# Patient Record
Sex: Female | Born: 1940 | Race: White | Hispanic: No | Marital: Married | Smoking: Never smoker
Health system: Southern US, Community
[De-identification: ages and names within clinical notes are randomized; demographics above are authoritative.]

## PROBLEM LIST (undated history)

## (undated) DIAGNOSIS — M1990 Unspecified osteoarthritis, unspecified site: Secondary | ICD-10-CM

## (undated) DIAGNOSIS — Z8739 Personal history of other diseases of the musculoskeletal system and connective tissue: Secondary | ICD-10-CM

## (undated) DIAGNOSIS — M549 Dorsalgia, unspecified: Secondary | ICD-10-CM

---

## 1997-12-12 ENCOUNTER — Emergency Department (HOSPITAL_COMMUNITY): Admission: EM | Admit: 1997-12-12 | Discharge: 1997-12-12 | Payer: Self-pay | Admitting: Internal Medicine

## 2003-03-03 ENCOUNTER — Encounter
Admission: RE | Admit: 2003-03-03 | Discharge: 2003-03-03 | Payer: Self-pay | Admitting: Physical Medicine and Rehabilitation

## 2006-03-11 ENCOUNTER — Encounter: Admission: RE | Admit: 2006-03-11 | Discharge: 2006-03-11 | Payer: Self-pay | Admitting: Obstetrics and Gynecology

## 2006-04-01 ENCOUNTER — Encounter: Admission: RE | Admit: 2006-04-01 | Discharge: 2006-04-01 | Payer: Self-pay | Admitting: Obstetrics and Gynecology

## 2019-10-25 ENCOUNTER — Encounter (HOSPITAL_COMMUNITY): Payer: Self-pay | Admitting: Emergency Medicine

## 2019-10-25 ENCOUNTER — Other Ambulatory Visit: Payer: Self-pay

## 2019-10-25 ENCOUNTER — Emergency Department (HOSPITAL_COMMUNITY): Payer: Medicare Other

## 2019-10-25 ENCOUNTER — Emergency Department (HOSPITAL_COMMUNITY)
Admission: EM | Admit: 2019-10-25 | Discharge: 2019-10-25 | Disposition: A | Discharge status: Home / Self Care | Payer: Medicare Other | Attending: Emergency Medicine | Admitting: Emergency Medicine

## 2019-10-25 DIAGNOSIS — R42 Dizziness and giddiness: Secondary | ICD-10-CM | POA: Diagnosis not present

## 2019-10-25 DIAGNOSIS — R509 Fever, unspecified: Secondary | ICD-10-CM | POA: Diagnosis present

## 2019-10-25 DIAGNOSIS — Z79899 Other long term (current) drug therapy: Secondary | ICD-10-CM | POA: Insufficient documentation

## 2019-10-25 DIAGNOSIS — R4182 Altered mental status, unspecified: Secondary | ICD-10-CM | POA: Diagnosis not present

## 2019-10-25 DIAGNOSIS — U071 COVID-19: Secondary | ICD-10-CM | POA: Insufficient documentation

## 2019-10-25 HISTORY — DX: Unspecified osteoarthritis, unspecified site: M19.90

## 2019-10-25 HISTORY — DX: Dorsalgia, unspecified: M54.9

## 2019-10-25 HISTORY — DX: Personal history of other diseases of the musculoskeletal system and connective tissue: Z87.39

## 2019-10-25 LAB — URINALYSIS, ROUTINE W REFLEX MICROSCOPIC
Bilirubin Urine: NEGATIVE
Glucose, UA: NEGATIVE mg/dL
Hgb urine dipstick: NEGATIVE
Ketones, ur: NEGATIVE mg/dL
Leukocytes,Ua: NEGATIVE
Nitrite: NEGATIVE
Protein, ur: NEGATIVE mg/dL
Specific Gravity, Urine: 1.021 (ref 1.005–1.030)
pH: 6 (ref 5.0–8.0)

## 2019-10-25 LAB — COMPREHENSIVE METABOLIC PANEL
ALT: 42 U/L (ref 0–44)
AST: 59 U/L — ABNORMAL HIGH (ref 15–41)
Albumin: 3.5 g/dL (ref 3.5–5.0)
Alkaline Phosphatase: 60 U/L (ref 38–126)
Anion gap: 12 (ref 5–15)
BUN: 29 mg/dL — ABNORMAL HIGH (ref 8–23)
CO2: 28 mmol/L (ref 22–32)
Calcium: 8.7 mg/dL — ABNORMAL LOW (ref 8.9–10.3)
Chloride: 94 mmol/L — ABNORMAL LOW (ref 98–111)
Creatinine, Ser: 1.04 mg/dL — ABNORMAL HIGH (ref 0.44–1.00)
GFR calc Af Amer: >60 mL/min (ref >60)
GFR calc non Af Amer: 52 mL/min — ABNORMAL LOW (ref >60)
Glucose, Bld: 91 mg/dL (ref 70–99)
Potassium: 4.3 mmol/L (ref 3.5–5.1)
Sodium: 134 mmol/L — ABNORMAL LOW (ref 135–145)
Total Bilirubin: 0.4 mg/dL (ref 0.3–1.2)
Total Protein: 7.6 g/dL (ref 6.5–8.1)

## 2019-10-25 LAB — CBC WITH DIFFERENTIAL/PLATELET
Abs Immature Granulocytes: 0.05 10*3/uL (ref 0.00–0.07)
Basophils Absolute: 0 10*3/uL (ref 0.0–0.1)
Basophils Relative: 1 %
Eosinophils Absolute: 0.1 10*3/uL (ref 0.0–0.5)
Eosinophils Relative: 1 %
HCT: 37.1 % (ref 36.0–46.0)
Hemoglobin: 12 g/dL (ref 12.0–15.0)
Immature Granulocytes: 1 %
Lymphocytes Relative: 28 %
Lymphs Abs: 1.7 10*3/uL (ref 0.7–4.0)
MCH: 30.8 pg (ref 26.0–34.0)
MCHC: 32.3 g/dL (ref 30.0–36.0)
MCV: 95.4 fL (ref 80.0–100.0)
Monocytes Absolute: 1 10*3/uL (ref 0.1–1.0)
Monocytes Relative: 17 %
Neutro Abs: 3.3 10*3/uL (ref 1.7–7.7)
Neutrophils Relative %: 52 %
Platelets: 218 10*3/uL (ref 150–400)
RBC: 3.89 MIL/uL (ref 3.87–5.11)
RDW: 13.4 % (ref 11.5–15.5)
WBC: 6.3 10*3/uL (ref 4.0–10.5)
nRBC: 0 % (ref 0.0–0.2)

## 2019-10-25 LAB — LACTIC ACID, PLASMA: Lactic Acid, Venous: 1.6 mmol/L (ref 0.5–1.9)

## 2019-10-25 LAB — SARS CORONAVIRUS 2 BY RT PCR (HOSPITAL ORDER, PERFORMED IN ~~LOC~~ HOSPITAL LAB): SARS Coronavirus 2: POSITIVE — AB

## 2019-10-25 LAB — CBG MONITORING, ED: Glucose-Capillary: 98 mg/dL (ref 70–99)

## 2019-10-25 LAB — AMMONIA: Ammonia: 30 umol/L (ref 9–35)

## 2019-10-25 MED ORDER — ACETAMINOPHEN 500 MG PO TABS
1000.0000 mg | ORAL_TABLET | Freq: Once | ORAL | Status: AC
  Administered 2019-10-25: 1000 mg via ORAL
  Filled 2019-10-25: qty 2

## 2019-10-25 NOTE — ED Triage Notes (Signed)
Patient presents after a visit to urgent care. While at Urgent care she was examined for a possible stroke, but that was ruled out. However, because today she has had a fever, confusion, dizziness, trouble concentrating and completing simple tasks, she was told to come here. The son reports her pupils have been non-reactive and he saw her staggering across the room earlier. She normally receives care for back pain which causes leg weakness and trouble standing. She takes opoid pain medication.

## 2019-10-25 NOTE — ED Provider Notes (Signed)
[facility]-EMERGENCY DEPT Provider Note   CSN: [id number] Arrival date & time: 10/25/19  1715     History Chief Complaint  Patient presents with  . Dizziness  . Altered Mental Status  . Fever    Nina White is a 79 y.o. female presenting for evaluation of confusion, difficulty walking, and dizziness.  , 5 caveat due to confusion.  History provided mostly by patient son, Thayer Ohm.  He states patient has not been acting herself today.  When she was walking, as if she was "drunk."  They thought this was due to chronic back pain, and saw the Ortho urgent care PA.  Patient then reported that she was having dizziness.  She did receive her third Covid vaccine today, but due to her symptoms ended up coming to the ER.  Son reports a mild cough that was noted today.  Patient denies headache, chest pain, shortness of breath, nausea, vomiting abdominal pain, urinary symptoms, abnormal bowel movements.  Patient and son were unaware that she had a fever.  Additional history obtained from chart review.  Patient with a history of RA, is on immunosuppression for this.  Has a history of chronic back pain, is on fentanyl patch and hydrocodone.  HPI     Past Medical History:  Diagnosis Date  . Arthritis   . Back pain   . H/O degenerative disc disease     There are no problems to display for this patient.   History reviewed. No pertinent surgical history.   OB History   No obstetric history on file.     History reviewed. No pertinent family history.  Social History   Tobacco Use  . Smoking status: Never Smoker  . Smokeless tobacco: Never Used  Substance Use Topics  . Alcohol use: Not on file  . Drug use: Not on file    Home Medications Prior to Admission medications   Medication Sig Start Date End Date Taking? Authorizing Provider  acyclovir (ZOVIRAX) 200 MG capsule Take 200 mg by mouth daily.  10/22/19  Yes [provider]  alendronate (FOSAMAX)  70 MG tablet Take 70 mg by mouth once a week. 08/01/19  Yes [provider]  amitriptyline (ELAVIL) 25 MG tablet Take 25 mg by mouth at bedtime.  10/22/19  Yes [provider]  baclofen (LIORESAL) 10 MG tablet Take 10 mg by mouth 2 (two) times daily.  10/21/19  Yes [provider]  brimonidine (ALPHAGAN) 0.2 % ophthalmic solution Place 1 drop into both eyes in the morning and at bedtime.  10/21/19  Yes [provider]  fentaNYL (DURAGESIC) 25 MCG/HR Place 1 patch onto the skin every 3 (three) days.  10/13/19  Yes [provider]  gabapentin (NEURONTIN) 400 MG capsule Take 400-800 mg by mouth 2 (two) times daily. Take 2 capsules (800 mg) in the morning and Take 1 capsule (400 mg) at bedtime 09/20/19  Yes [provider]  HYDROcodone-acetaminophen (NORCO) 10-325 MG tablet Take 1 tablet by mouth 3 (three) times daily as needed for moderate pain or severe pain.  10/13/19  Yes [provider]  levothyroxine (SYNTHROID) 88 MCG tablet Take 88 mcg by mouth daily. 08/23/19  Yes [provider]  lovastatin (MEVACOR) 10 MG tablet Take 10 mg by mouth daily. 10/21/19  Yes [provider]    Allergies    Erythromycin and Tetracyclines & related  Review of Systems   Review of Systems  Neurological: Positive for dizziness.  Gait abnormality  Psychiatric/Behavioral: Positive for confusion.  All other systems reviewed and are negative.   Physical Exam Updated Vital Signs BP (!) 100/56   Pulse 62   Temp (!) 102.7 F (39.3 C) (Oral)   Resp (!) 24   Ht 5' (1.524 m)   Wt 59 kg   SpO2 99%   BMI 25.39 kg/m   Physical Exam Vitals and nursing note reviewed.  Constitutional:      General: She is not in acute distress.    Appearance: She is well-developed.     Comments: Appears nontoxic  HENT:     Head: Normocephalic and atraumatic.  Eyes:     Conjunctiva/sclera: Conjunctivae normal.     Pupils: Pupils are equal, round, and  reactive to light.  Cardiovascular:     Rate and Rhythm: Normal rate and regular rhythm.     Pulses: Normal pulses.  Pulmonary:     Effort: Pulmonary effort is normal. No respiratory distress.     Breath sounds: Normal breath sounds. No wheezing.     Comments: Speaking in full sentences.  Clear lung sounds in all fields. Abdominal:     General: There is no distension.     Palpations: Abdomen is soft. There is no mass.     Tenderness: There is no abdominal tenderness. There is no guarding or rebound.  Musculoskeletal:        General: Normal range of motion.     Cervical back: Normal range of motion and neck supple.  Skin:    General: Skin is warm and dry.     Capillary Refill: Capillary refill takes less than 2 seconds.  Neurological:     General: No focal deficit present.     Mental Status: She is alert and oriented to person, place, and time.     GCS: GCS eye subscore is 4. GCS verbal subscore is 5. GCS motor subscore is 6.     Cranial Nerves: Cranial nerves are intact.     Sensory: Sensation is intact.     Motor: Motor function is intact.     Coordination: Coordination is intact.     Comments: CN intact.  Nose to finger intact.  Fine movement and coordination intact.  Strength and sensation intact x4.  After antipyretics, patient ambulated without ataxia.     ED Results / Procedures / Treatments   Labs (all labs ordered are listed, but only abnormal results are displayed) Labs Reviewed  SARS CORONAVIRUS 2 BY RT PCR (HOSPITAL ORDER, PERFORMED IN [facility]) - Abnormal; Notable for the following components:      Result Value   SARS Coronavirus 2 POSITIVE (*)    All other components within normal limits  COMPREHENSIVE METABOLIC PANEL - Abnormal; Notable for the following components:   Sodium 134 (*)    Chloride 94 (*)    BUN 29 (*)    Creatinine, Ser 1.04 (*)    Calcium 8.7 (*)    AST 59 (*)    GFR calc non Af Amer 52 (*)    All other components within  normal limits  URINE CULTURE  CULTURE, BLOOD (ROUTINE X 2)  CULTURE, BLOOD (ROUTINE X 2)  CBC WITH DIFFERENTIAL/PLATELET  AMMONIA  URINALYSIS, ROUTINE W REFLEX MICROSCOPIC  LACTIC ACID, PLASMA  LACTIC ACID, PLASMA  CBG MONITORING, ED    EKG EKG Interpretation  Date/Time:  Monday October 25 2019 18:02:05 EDT Ventricular Rate:  72 PR Interval:    QRS Duration:  81 QT Interval:  368 QTC Calculation: 403 R Axis:   10 Text Interpretation: Sinus rhythm Atrial premature complex Low voltage, precordial leads No old tracing to compare Confirmed by Meridee Score [phone]) on 10/25/2019 6:56:53 PM   Radiology DG Chest Portable 1 View  Result Date: 10/25/2019 CLINICAL DATA:  Fever and dizziness. EXAM: PORTABLE CHEST 1 VIEW COMPARISON:  August 05, 2017 FINDINGS: Mild diffuse chronic appearing increased lung markings are seen with a trace amount of bibasilar atelectasis. There is no evidence of acute infiltrate, pleural effusion or pneumothorax. The heart size and mediastinal contours are within normal limits. The visualized skeletal structures are unremarkable. IMPRESSION: No active disease. Electronically Signed   By: Aram Candela M.D.   On: 10/25/2019 19:49    Procedures Procedures (including critical care time)  Medications Ordered in ED Medications  acetaminophen (TYLENOL) tablet 1,000 mg (1,000 mg Oral Given 10/25/19 1855)    ED Course  I have reviewed the triage vital signs and the nursing notes.  Pertinent labs & imaging results that were available during my care of the patient were reviewed by me and considered in my medical decision making (see chart for details).  Clinical Course as of Oct 24 2105  Mon Oct 25, 2019  5352 79 year old female history of rheumatoid arthritis chronic narcotics for pain here with gait imbalance confusion starting sometime today likely preceding her Covid booster shot.  She does appear with a fever 103.  Denies any infectious complaints.  Labs  chest x-ray urinalysis Covid testing.  Disposition per results of work-up.   [MB]    Clinical Course User Index [MB] Terrilee Files, MD   MDM Rules/Calculators/A&P                          Patient presenting for evaluation of confusion and gait difficulty.  On exam, patient appears nontoxic.  Patient is febrile up to 103, this is likely the cause for her symptoms.  Less likely stroke without focal neurologic deficits.  Will obtain chest x-ray and urine to look for infection.  Will obtain labs to ensure no systemic response, but vital signs overall are reassuring. Case discussed with attending, Dr. Charm Barges evaluated the pt.   Labs interpreted by me, overall reassuring.  No acidosis.  Kidney and liver function normal.  Electrolytes stable.  EKG without ischemia.  Chest x-ray read interpreted by me, no pneumonia, pnx, effusion, cardiomegaly.  Urine without infection.  Blood cultures sent and pending.  Covid test positive.  This is likely the cause for fever.  On reassessment after antipyretics, patient is symptom-free.  She is ambulatory without ataxia.  I do not believe she needs MRI or imaging of the head at this time.  After ambulation, patient sats remained above 95%.  She does not have any signs of respiratory distress.  I do not believe she needs to be admitted for Covid.  I discussed findings with patient and son.  Discussed importance of isolation, as well as quarantine of anyone who has been around her who is not vaccinated.  Discussed monitoring for respiratory status, and return with any worsening.  At this time, patient appears safe for discharge.  Return precautions given.  Patient and son state they understand and agree to plan.  Final Clinical Impression(s) / ED Diagnoses Final diagnoses:  COVID-19    Rx / DC Orders ED Discharge Orders    None       Mykenna Viele, PA-C  10/25/19 2115    Terrilee Files, MD 10/26/19 1047

## 2019-10-25 NOTE — ED Notes (Signed)
Patient's son says the patient had the 3rd Moderna shot today.

## 2019-10-25 NOTE — Discharge Instructions (Signed)
Quarantine Quarantine if you have been in close contact (within 6 feet of someone for a cumulative total of 15 minutes or more over a 24-hour period) with someone who has COVID-19, unless you have been fully vaccinated. People who are fully vaccinated do NOT need to quarantine after contact with someone who had COVID-19 unless they have symptoms. However, fully vaccinated people should get tested 3-5 days after their exposure, even if they don't have symptoms and wear a mask indoors in public for 14 days following exposure or until their test result is negative.  What to do Stay home for 14 days after your last contact with a person who has COVID-19. Watch for fever (100.4?F), cough, shortness of breath, or other symptoms of COVID-19. If possible, stay away from people you live with, especially people who are at higher risk for getting very sick from COVID-19. After quarantine Watch for symptoms until 14 days after exposure. If you have symptoms, immediately self-isolate and contact your local public health authority or healthcare provider. You may be able to shorten your quarantine Your local public health authorities make the final decisions about how long quarantine should last, based on local conditions and needs. Follow the recommendations of your local public health department if you need to quarantine. Options they will consider include stopping quarantine  After day 10 without testing After day 7 after receiving a negative test result (test must occur on day 5 or later)  Isolation Isolation is used to separate people infected with COVID-19 from those who are not infected.  People who are in isolation should stay home until it's safe for them to be around others. At home, anyone sick or infected should separate from others, stay in a specific "sick room" or area, and use a separate bathroom (if available).  What to do Monitor your symptoms. If you have an emergency warning sign  (including trouble breathing), seek emergency medical care immediately. Stay in a separate room from other household members, if possible. Use a separate bathroom, if possible. Avoid contact with other members of the household and pets. Don't share personal household items, like cups, towels, and utensils. Wear a mask when around other people if able. Learn more about what to do if you are sick and how to notify your contacts.  For Anyone Who Has Been Around a Person with COVID-19 Anyone who has had close contact with someone with COVID-19 should stay home for 14 days after their last exposure to that person. However, anyone who has had close contact with someone with COVID-19 and who meets the following criteria does NOT need to stay home.  Someone who has been fully vaccinated and shows no symptoms of COVID-19. However, fully vaccinated people should get tested 3-5 days after their exposure, even they don't have symptoms and wear a mask indoors in public for 14 days following exposure or until their test result is negative. Or  Someone who has COVID-19 illness within the previous 3 months and Has recovered and Remains without COVID-19 symptoms (for example, cough, shortness of breath) I think or know I had COVID-19, and I had symptoms You can be around others after:  10 days since symptoms first appeared and 24 hours with no fever without the use of fever-reducing medications and Other symptoms of COVID-19 are improving* *Loss of taste and smell may persist for weeks or months after recovery and need not delay the end of isolation   I tested positive for COVID-19 but had no  symptoms If you continue to have no symptoms, you can be with others after 10 days have passed since you had a positive viral test for COVID-19.  If you develop symptoms after testing positive, follow the guidance above for "I think or know I had COVID-19, and I had symptoms."  I was severely ill with COVID-19 or  have a weakened immune system (immunocompromised) caused by a health condition or medication. People who are severely ill with COVID-19 might need to stay home longer than 10 days and up to 20 days after symptoms first appeared. People with weakened immune systems may require testing to determine when they can be around others. Talk to your healthcare provider for more information. Your healthcare provider will let you know if you can resume being around other people based on the results of your testing.  People who are immunocompromised should be counseled about the potential for reduced immune responses to COVID-19 vaccines and the need to continue to follow?current prevention measures?(including wearing a mask, staying 6 feet apart from others?they don't live with, and avoiding crowds and poorly ventilated indoor spaces) to protect themselves against COVID-19 until advised otherwise by their healthcare provider. Close contacts of immunocompromised people should also be encouraged to be vaccinated against COVID-19 to help protect these people.

## 2019-10-26 ENCOUNTER — Telehealth: Payer: Self-pay | Admitting: Oncology

## 2019-10-26 ENCOUNTER — Telehealth (HOSPITAL_COMMUNITY): Payer: Self-pay | Admitting: Nurse Practitioner

## 2019-10-26 DIAGNOSIS — U071 COVID-19: Secondary | ICD-10-CM

## 2019-10-26 LAB — URINE CULTURE

## 2019-10-26 NOTE — Telephone Encounter (Signed)
Called to Discuss with patient about Covid symptoms and the use of regeneron, a monoclonal antibody infusion for those with mild to moderate Covid symptoms and at a high risk of hospitalization.     Pt is qualified for this infusion at the [facility] due to co-morbid conditions and/or a member of an at-risk group.     Unable to reach pt. Left message to return call  Jehan Ranganathan, DNP, AGNP-C [phone] (Infusion Center Hotline)  

## 2019-10-27 ENCOUNTER — Other Ambulatory Visit: Payer: Self-pay

## 2019-10-27 ENCOUNTER — Emergency Department (HOSPITAL_BASED_OUTPATIENT_CLINIC_OR_DEPARTMENT_OTHER): Payer: Medicare (Managed Care)

## 2019-10-27 ENCOUNTER — Inpatient Hospital Stay (HOSPITAL_BASED_OUTPATIENT_CLINIC_OR_DEPARTMENT_OTHER)
Admission: EM | Admit: 2019-10-27 | Discharge: 2019-10-30 | DRG: 177 | Disposition: A | Discharge status: Home / Self Care | Payer: Medicare (Managed Care) | Attending: Student | Admitting: Student

## 2019-10-27 ENCOUNTER — Encounter (HOSPITAL_BASED_OUTPATIENT_CLINIC_OR_DEPARTMENT_OTHER): Payer: Self-pay

## 2019-10-27 DIAGNOSIS — G894 Chronic pain syndrome: Secondary | ICD-10-CM | POA: Diagnosis not present

## 2019-10-27 DIAGNOSIS — R627 Adult failure to thrive: Secondary | ICD-10-CM | POA: Diagnosis present

## 2019-10-27 DIAGNOSIS — E860 Dehydration: Secondary | ICD-10-CM | POA: Diagnosis present

## 2019-10-27 DIAGNOSIS — G9341 Metabolic encephalopathy: Secondary | ICD-10-CM | POA: Diagnosis present

## 2019-10-27 DIAGNOSIS — E876 Hypokalemia: Secondary | ICD-10-CM | POA: Diagnosis present

## 2019-10-27 DIAGNOSIS — R030 Elevated blood-pressure reading, without diagnosis of hypertension: Secondary | ICD-10-CM | POA: Diagnosis not present

## 2019-10-27 DIAGNOSIS — T50B95A Adverse effect of other viral vaccines, initial encounter: Secondary | ICD-10-CM | POA: Diagnosis present

## 2019-10-27 DIAGNOSIS — E785 Hyperlipidemia, unspecified: Secondary | ICD-10-CM | POA: Diagnosis present

## 2019-10-27 DIAGNOSIS — R509 Fever, unspecified: Secondary | ICD-10-CM | POA: Diagnosis present

## 2019-10-27 DIAGNOSIS — U071 COVID-19: Principal | ICD-10-CM

## 2019-10-27 DIAGNOSIS — M069 Rheumatoid arthritis, unspecified: Secondary | ICD-10-CM | POA: Diagnosis present

## 2019-10-27 DIAGNOSIS — Z79891 Long term (current) use of opiate analgesic: Secondary | ICD-10-CM | POA: Diagnosis not present

## 2019-10-27 DIAGNOSIS — Z9189 Other specified personal risk factors, not elsewhere classified: Secondary | ICD-10-CM | POA: Diagnosis not present

## 2019-10-27 DIAGNOSIS — G8929 Other chronic pain: Secondary | ICD-10-CM | POA: Diagnosis present

## 2019-10-27 DIAGNOSIS — R112 Nausea with vomiting, unspecified: Secondary | ICD-10-CM

## 2019-10-27 DIAGNOSIS — Z881 Allergy status to other antibiotic agents status: Secondary | ICD-10-CM | POA: Diagnosis not present

## 2019-10-27 DIAGNOSIS — I10 Essential (primary) hypertension: Secondary | ICD-10-CM | POA: Diagnosis present

## 2019-10-27 DIAGNOSIS — M549 Dorsalgia, unspecified: Secondary | ICD-10-CM | POA: Diagnosis present

## 2019-10-27 DIAGNOSIS — Z6823 Body mass index (BMI) 23.0-23.9, adult: Secondary | ICD-10-CM | POA: Diagnosis not present

## 2019-10-27 DIAGNOSIS — Z7983 Long term (current) use of bisphosphonates: Secondary | ICD-10-CM

## 2019-10-27 DIAGNOSIS — Z79899 Other long term (current) drug therapy: Secondary | ICD-10-CM | POA: Diagnosis not present

## 2019-10-27 DIAGNOSIS — M1990 Unspecified osteoarthritis, unspecified site: Secondary | ICD-10-CM | POA: Diagnosis present

## 2019-10-27 DIAGNOSIS — Z79890 Hormone replacement therapy (postmenopausal): Secondary | ICD-10-CM | POA: Diagnosis not present

## 2019-10-27 DIAGNOSIS — R590 Localized enlarged lymph nodes: Secondary | ICD-10-CM | POA: Diagnosis present

## 2019-10-27 DIAGNOSIS — E039 Hypothyroidism, unspecified: Secondary | ICD-10-CM | POA: Diagnosis present

## 2019-10-27 DIAGNOSIS — R0600 Dyspnea, unspecified: Secondary | ICD-10-CM

## 2019-10-27 DIAGNOSIS — E871 Hypo-osmolality and hyponatremia: Secondary | ICD-10-CM | POA: Diagnosis present

## 2019-10-27 DIAGNOSIS — Z23 Encounter for immunization: Secondary | ICD-10-CM | POA: Diagnosis not present

## 2019-10-27 LAB — CBC WITH DIFFERENTIAL/PLATELET
Abs Immature Granulocytes: 0.01 10*3/uL (ref 0.00–0.07)
Basophils Absolute: 0 10*3/uL (ref 0.0–0.1)
Basophils Relative: 0 %
Eosinophils Absolute: 0 10*3/uL (ref 0.0–0.5)
Eosinophils Relative: 1 %
HCT: 39.1 % (ref 36.0–46.0)
Hemoglobin: 13.2 g/dL (ref 12.0–15.0)
Immature Granulocytes: 0 %
Lymphocytes Relative: 24 %
Lymphs Abs: 1 10*3/uL (ref 0.7–4.0)
MCH: 31 pg (ref 26.0–34.0)
MCHC: 33.8 g/dL (ref 30.0–36.0)
MCV: 91.8 fL (ref 80.0–100.0)
Monocytes Absolute: 0.5 10*3/uL (ref 0.1–1.0)
Monocytes Relative: 13 %
Neutro Abs: 2.7 10*3/uL (ref 1.7–7.7)
Neutrophils Relative %: 62 %
Platelets: 179 10*3/uL (ref 150–400)
RBC: 4.26 MIL/uL (ref 3.87–5.11)
RDW: 12.8 % (ref 11.5–15.5)
WBC: 4.3 10*3/uL (ref 4.0–10.5)
nRBC: 0 % (ref 0.0–0.2)

## 2019-10-27 LAB — BASIC METABOLIC PANEL
Anion gap: 10 (ref 5–15)
Anion gap: 13 (ref 5–15)
BUN: 13 mg/dL (ref 8–23)
BUN: 9 mg/dL (ref 8–23)
CO2: 23 mmol/L (ref 22–32)
CO2: 21 mmol/L — ABNORMAL LOW (ref 22–32)
Calcium: 8.6 mg/dL — ABNORMAL LOW (ref 8.9–10.3)
Calcium: 8.5 mg/dL — ABNORMAL LOW (ref 8.9–10.3)
Chloride: 90 mmol/L — ABNORMAL LOW (ref 98–111)
Chloride: 94 mmol/L — ABNORMAL LOW (ref 98–111)
Creatinine, Ser: 0.94 mg/dL (ref 0.44–1.00)
Creatinine, Ser: 0.74 mg/dL (ref 0.44–1.00)
GFR calc Af Amer: >60 mL/min (ref >60)
GFR calc Af Amer: >60 mL/min (ref >60)
GFR calc non Af Amer: 59 mL/min — ABNORMAL LOW (ref >60)
GFR calc non Af Amer: >60 mL/min (ref >60)
Glucose, Bld: 119 mg/dL — ABNORMAL HIGH (ref 70–99)
Glucose, Bld: 120 mg/dL — ABNORMAL HIGH (ref 70–99)
Potassium: 4.1 mmol/L (ref 3.5–5.1)
Potassium: 4.2 mmol/L (ref 3.5–5.1)
Sodium: 123 mmol/L — ABNORMAL LOW (ref 135–145)
Sodium: 128 mmol/L — ABNORMAL LOW (ref 135–145)

## 2019-10-27 LAB — TRIGLYCERIDES: Triglycerides: 81 mg/dL (ref <150)

## 2019-10-27 LAB — C-REACTIVE PROTEIN: CRP: 1.8 mg/dL — ABNORMAL HIGH (ref <1.0)

## 2019-10-27 LAB — URINALYSIS, ROUTINE W REFLEX MICROSCOPIC
Bilirubin Urine: NEGATIVE
Glucose, UA: NEGATIVE mg/dL
Hgb urine dipstick: NEGATIVE
Ketones, ur: NEGATIVE mg/dL
Leukocytes,Ua: NEGATIVE
Nitrite: NEGATIVE
Protein, ur: NEGATIVE mg/dL
Specific Gravity, Urine: 1.01 (ref 1.005–1.030)
pH: 8 (ref 5.0–8.0)

## 2019-10-27 LAB — HEPATIC FUNCTION PANEL
ALT: 35 U/L (ref 0–44)
AST: 53 U/L — ABNORMAL HIGH (ref 15–41)
Albumin: 3.4 g/dL — ABNORMAL LOW (ref 3.5–5.0)
Alkaline Phosphatase: 58 U/L (ref 38–126)
Bilirubin, Direct: 0.2 mg/dL (ref 0.0–0.2)
Indirect Bilirubin: 0.4 mg/dL (ref 0.3–0.9)
Total Bilirubin: 0.6 mg/dL (ref 0.3–1.2)
Total Protein: 7.6 g/dL (ref 6.5–8.1)

## 2019-10-27 LAB — LIPASE, BLOOD: Lipase: 46 U/L (ref 11–51)

## 2019-10-27 LAB — D-DIMER, QUANTITATIVE: D-Dimer, Quant: 0.69 ug/mL-FEU — ABNORMAL HIGH (ref 0.00–0.50)

## 2019-10-27 LAB — FERRITIN: Ferritin: 73 ng/mL (ref 11–307)

## 2019-10-27 LAB — SODIUM, URINE, RANDOM: Sodium, Ur: 60 mmol/L

## 2019-10-27 LAB — PROCALCITONIN: Procalcitonin: 0.12 ng/mL

## 2019-10-27 LAB — FIBRINOGEN: Fibrinogen: 439 mg/dL (ref 210–475)

## 2019-10-27 LAB — LACTATE DEHYDROGENASE: LDH: 225 U/L — ABNORMAL HIGH (ref 98–192)

## 2019-10-27 MED ORDER — ENOXAPARIN SODIUM 40 MG/0.4ML ~~LOC~~ SOLN
40.0000 mg | SUBCUTANEOUS | Status: DC
  Administered 2019-10-27 – 2019-10-29 (×3): 40 mg via SUBCUTANEOUS
  Filled 2019-10-27 (×3): qty 0.4

## 2019-10-27 MED ORDER — SODIUM CHLORIDE 0.9 % IV SOLN
1200.0000 mg | Freq: Once | INTRAVENOUS | Status: AC
  Administered 2019-10-27: 1200 mg via INTRAVENOUS
  Filled 2019-10-27: qty 10

## 2019-10-27 MED ORDER — ACETAMINOPHEN 650 MG RE SUPP: 650.0000 mg | Freq: Four times a day (QID) | RECTAL | Status: DC | PRN

## 2019-10-27 MED ORDER — SODIUM CHLORIDE 0.9 % IV SOLN: INTRAVENOUS | Status: DC

## 2019-10-27 MED ORDER — SODIUM CHLORIDE 0.9 % IV SOLN: INTRAVENOUS | Status: DC | PRN

## 2019-10-27 MED ORDER — ONDANSETRON HCL 4 MG/2ML IJ SOLN
4.0000 mg | Freq: Once | INTRAMUSCULAR | Status: AC
  Administered 2019-10-27: 4 mg via INTRAVENOUS
  Filled 2019-10-27: qty 2

## 2019-10-27 MED ORDER — DIPHENHYDRAMINE HCL 50 MG/ML IJ SOLN: 50.0000 mg | Freq: Once | INTRAMUSCULAR | Status: DC | PRN

## 2019-10-27 MED ORDER — EPINEPHRINE 0.3 MG/0.3ML IJ SOAJ
0.3000 mg | Freq: Once | INTRAMUSCULAR | Status: DC | PRN
  Filled 2019-10-27: qty 0.6

## 2019-10-27 MED ORDER — SODIUM CHLORIDE 0.9 % IV BOLUS
1000.0000 mL | Freq: Once | INTRAVENOUS | Status: AC
  Administered 2019-10-27: 1000 mL via INTRAVENOUS

## 2019-10-27 MED ORDER — LIP MEDEX EX OINT: TOPICAL_OINTMENT | CUTANEOUS | Status: AC | PRN

## 2019-10-27 MED ORDER — LIP MEDEX EX OINT
TOPICAL_OINTMENT | CUTANEOUS | Status: AC
  Administered 2019-10-27: 1
  Filled 2019-10-27: qty 7

## 2019-10-27 MED ORDER — FAMOTIDINE IN NACL 20-0.9 MG/50ML-% IV SOLN: 20.0000 mg | Freq: Once | INTRAVENOUS | Status: DC | PRN

## 2019-10-27 MED ORDER — ONDANSETRON HCL 4 MG/2ML IJ SOLN
4.0000 mg | Freq: Four times a day (QID) | INTRAMUSCULAR | Status: DC | PRN
  Administered 2019-10-27 – 2019-10-28 (×2): 4 mg via INTRAVENOUS
  Filled 2019-10-27 (×2): qty 2

## 2019-10-27 MED ORDER — METHYLPREDNISOLONE SODIUM SUCC 125 MG IJ SOLR: 125.0000 mg | Freq: Once | INTRAMUSCULAR | Status: DC | PRN

## 2019-10-27 MED ORDER — ACETAMINOPHEN 325 MG PO TABS
650.0000 mg | ORAL_TABLET | Freq: Once | ORAL | Status: AC
  Administered 2019-10-27: 650 mg via ORAL
  Filled 2019-10-27: qty 2

## 2019-10-27 MED ORDER — ALBUTEROL SULFATE HFA 108 (90 BASE) MCG/ACT IN AERS: 2.0000 | INHALATION_SPRAY | Freq: Once | RESPIRATORY_TRACT | Status: DC | PRN

## 2019-10-27 MED ORDER — ACETAMINOPHEN 325 MG PO TABS: 650.0000 mg | ORAL_TABLET | Freq: Four times a day (QID) | ORAL | Status: DC | PRN

## 2019-10-27 MED ORDER — HYDRALAZINE HCL 20 MG/ML IJ SOLN
5.0000 mg | INTRAMUSCULAR | Status: DC | PRN
  Administered 2019-10-27 – 2019-10-28 (×2): 5 mg via INTRAVENOUS
  Filled 2019-10-27 (×2): qty 1

## 2019-10-27 NOTE — ED Notes (Signed)
Pt pulled her monitor leads and BP cuff, got dressed , reports want leave.This Rn explained that she has a bed available and will be transferred soon. Pt appears to be confused. Family made ware and spoke to pt . Pt back in bed waiting for transport.

## 2019-10-27 NOTE — ED Triage Notes (Addendum)
Per pt and son pt with SOB, decreased po intake, fever-concerned pt may be dehydrated-son reports periods of confusion x 2 days that clear when no fever-dx with covid 2 days ago-pt to triage in w/c-NAD

## 2019-10-27 NOTE — ED Notes (Signed)
Pt moved from bed to bedside toilet unassisted

## 2019-10-27 NOTE — Plan of Care (Signed)
Plan of care discussed with Thayer Ohm and Patient.  Thayer Ohm reports having medical POA if she is unable to make decisions for herself. Copy requested.  Thayer Ohm will look for script for fentanyl patch to explain why Patient has one on abdomen.  Writer will inform On-Call of 25 mcg patch. Chronic low back and arthritis pain reported.

## 2019-10-27 NOTE — ED Notes (Signed)
Reprt called to [place], Charity fundraiser at [employer].

## 2019-10-27 NOTE — ED Provider Notes (Signed)
[facility] EMERGENCY DEPARTMENT Provider Note   CSN: [id number] Arrival date & time: 10/27/19  1234  History Chief Complaint  Patient presents with  . Shortness of Breath   Nina White is a 79 y.o. female.  79 year old female began having symptoms of dizziness and confusion Monday morning (8/30).  Son brought her to doctor's appointments and to get [place] booster.  She developed a fever of 103 and tested positive for Covid.  She has not been able to tolerate anything p.o. since Sunday night.  Attempts at feeding her have resulted in her vomiting.  She continues to have fevers and confusion that are improved with Tylenol. Son endorses mother having a yellow phlegm with cough. Blood cultures collected Monday were negative.     Past Medical History:  Diagnosis Date  . Arthritis   . Back pain   . H/O degenerative disc disease    There are no problems to display for this patient.  History reviewed. No pertinent surgical history.   OB History   No obstetric history on file.    No family history on file.  Social History   Tobacco Use  . Smoking status: Never Smoker  . Smokeless tobacco: Never Used  Substance Use Topics  . Alcohol use: Never  . Drug use: Not on file   Home Medications Prior to Admission medications   Medication Sig Start Date End Date Taking? Authorizing Provider  acyclovir (ZOVIRAX) 200 MG capsule Take 200 mg by mouth daily.  10/22/19   [provider]  alendronate (FOSAMAX) 70 MG tablet Take 70 mg by mouth once a week. 08/01/19   [provider]  amitriptyline (ELAVIL) 25 MG tablet Take 25 mg by mouth at bedtime.  10/22/19   [provider]  baclofen (LIORESAL) 10 MG tablet Take 10 mg by mouth 2 (two) times daily.  10/21/19   [provider]  brimonidine (ALPHAGAN) 0.2 % ophthalmic solution Place 1 drop into both eyes in the morning and at bedtime.  10/21/19   [provider]  fentaNYL (DURAGESIC) 25  MCG/HR Place 1 patch onto the skin every 3 (three) days.  10/13/19   [provider]  gabapentin (NEURONTIN) 400 MG capsule Take 400-800 mg by mouth 2 (two) times daily. Take 2 capsules (800 mg) in the morning and Take 1 capsule (400 mg) at bedtime 09/20/19   [provider]  HYDROcodone-acetaminophen (NORCO) 10-325 MG tablet Take 1 tablet by mouth 3 (three) times daily as needed for moderate pain or severe pain.  10/13/19   [provider]  levothyroxine (SYNTHROID) 88 MCG tablet Take 88 mcg by mouth daily. 08/23/19   [provider]  lovastatin (MEVACOR) 10 MG tablet Take 10 mg by mouth daily. 10/21/19   [provider]   Allergies    Erythromycin and Tetracyclines & related  Review of Systems   Review of Systems  Constitutional: Positive for activity change, appetite change, chills, fatigue and fever.  HENT: Positive for congestion.   Respiratory: Positive for cough and shortness of breath.   Gastrointestinal: Positive for nausea and vomiting. Negative for abdominal pain and diarrhea.  Genitourinary: Negative for difficulty urinating.  Neurological: Positive for dizziness.  Psychiatric/Behavioral: Positive for confusion.   Physical Exam Updated Vital Signs BP (!) 166/112 (BP Location: Right Arm)   Pulse 73   Temp 98.8 F (37.1 C) (Oral)   Resp 18   Ht 5\' 2"  (1.575 m)   Wt 59 kg  SpO2 100%   BMI 23.78 kg/m   Physical Exam Vitals and nursing note reviewed.  Constitutional:      General: She is not in acute distress.    Appearance: She is ill-appearing.  HENT:     Nose: Nose normal.     Mouth/Throat:     Lips: No lesions.     Mouth: Mucous membranes are dry.     Tongue: No lesions.     Pharynx: Oropharynx is clear. Posterior oropharyngeal erythema present.     Tonsils: No tonsillar exudate.  Pulmonary:     Effort: Pulmonary effort is normal. No accessory muscle usage or respiratory distress.     Breath sounds: Examination of the  right-upper field reveals rales. Examination of the left-upper field reveals rales. Decreased breath sounds and rales present.  Chest:     Chest wall: No tenderness.  Abdominal:     Palpations: Abdomen is soft.     Tenderness: There is no abdominal tenderness. There is no rebound.  Genitourinary:    Comments: Negative suprapubic tenderness Lymphadenopathy:     Cervical: Cervical adenopathy present.  Neurological:     Mental Status: She is alert.    ED Results / Procedures / Treatments   Labs (all labs ordered are listed, but only abnormal results are displayed) Labs Reviewed  BASIC METABOLIC PANEL - Abnormal; Notable for the following components:      Result Value   Sodium 123 (*)    Chloride 90 (*)    Glucose, Bld 119 (*)    Calcium 8.6 (*)    GFR calc non Af Amer 59 (*)    All other components within normal limits  CBC WITH DIFFERENTIAL/PLATELET  URINALYSIS, ROUTINE W REFLEX MICROSCOPIC  SODIUM, URINE, RANDOM   EKG None  Radiology DG Chest Portable 1 View  Result Date: 10/27/2019 CLINICAL DATA:  79 year old patient who is positive for COVID-19, presenting with shortness of breath, fever and confusion over the past 3 days. EXAM: PORTABLE CHEST 1 VIEW COMPARISON:  10/25/2019 and earlier. FINDINGS: Cardiac silhouette normal in size, unchanged. Prominent interstitial markings diffusely are unchanged when compared to prior x-rays in 2018 and are therefore chronic. Lungs otherwise clear. No localized airspace consolidation. No pleural effusions. No pneumothorax. Normal pulmonary vascularity. IMPRESSION: No acute cardiopulmonary disease. Electronically Signed   By: Hulan Saas M.D.   On: 10/27/2019 14:57   DG Chest Portable 1 View  Result Date: 10/25/2019 CLINICAL DATA:  Fever and dizziness. EXAM: PORTABLE CHEST 1 VIEW COMPARISON:  August 05, 2017 FINDINGS: Mild diffuse chronic appearing increased lung markings are seen with a trace amount of bibasilar atelectasis. There is no  evidence of acute infiltrate, pleural effusion or pneumothorax. The heart size and mediastinal contours are within normal limits. The visualized skeletal structures are unremarkable. IMPRESSION: No active disease. Electronically Signed   By: Aram Candela M.D.   On: 10/25/2019 19:49    Procedures Procedures (including critical care time)  Medications Ordered in ED Medications  sodium chloride 0.9 % bolus 1,000 mL (has no administration in time range)  ondansetron (ZOFRAN) injection 4 mg (has no administration in time range)  casirivimab-imdevimab (REGEN-COV) 1,200 mg in sodium chloride 0.9 % 110 mL IVPB (has no administration in time range)  0.9 %  sodium chloride infusion (has no administration in time range)  diphenhydrAMINE (BENADRYL) injection 50 mg (has no administration in time range)  famotidine (PEPCID) IVPB 20 mg premix (has no administration in time range)  methylPREDNISolone sodium succinate (  SOLU-MEDROL) 125 mg/2 mL injection 125 mg (has no administration in time range)  albuterol (VENTOLIN HFA) 108 (90 Base) MCG/ACT inhaler 2 puff (has no administration in time range)  EPINEPHrine (EPI-PEN) injection 0.3 mg (has no administration in time range)  acetaminophen (TYLENOL) tablet 650 mg (has no administration in time range)    ED Course  I have reviewed the triage vital signs and the nursing notes.  Pertinent labs & imaging results that were available during my care of the patient were reviewed by me and considered in my medical decision making (see chart for details).   MDM Rules/Calculators/A&P                         COVID-19 positive test on 8/30 on same day as got [place] booster. Patient is symptomatic with no PO leading to dehydration, subjective SOB. She appears to have stable respiratory state on presentation and is febrile. Patient given IV fluid bolus as well as zofran, tylenol, and infusion of casirivimab-imdevimab. She was observed after administration. Plan to  trial with PO challenge to see if patient is able to hydrate.  Update: patient is hyponatremic with low serum osmolality. Plan to check urine Na with urinalysis collection and continue hydration with NS. Paged hospitalist service for admission. Patient signed out to oncoming shift.  Final Clinical Impression(s) / ED Diagnoses Final diagnoses:  COVID-19  Dehydration  Hyponatremia   Rx / DC Orders ED Discharge Orders    None       Leeroy Bock, DO 10/27/19 1522    Melene Plan, DO 10/29/19 1507

## 2019-10-27 NOTE — Progress Notes (Signed)
Pt arrived via EMS  Transferred to bed without difficulty.  Pt verbalizes no pain but is c/o feelings of vomiting. Awaiting orders.

## 2019-10-27 NOTE — H&P (Signed)
History and Physical    Nina White ZNB:[id number] DOB: 23-May-1940 DOA: 10/27/2019  PCP: Patient, No Pcp Per Patient coming from: Home  Chief Complaint: Vomiting  HPI: Nina White is a 79 y.o. female with medical history significant of osteoarthritis, hyperlipidemia, hypothyroidism presented to the ED today with complaints of dizziness and confusion x2 days. Patient son brought her to her doctor's appointment to get a [place] booster on 8/30. The same day she developed a fever of 103 and tested positive for Covid during an ED visit. She has had intractable nausea and vomiting for the past 3 days. She has continued to have fevers and confusion that improved with Tylenol. Son reported patient having yellow phlegm with cough. Blood cultures drawn during ED visit on 8/30 showing NGTD.  Patient reports vomiting for the past 2 days.  Denies abdominal pain or diarrhea.  Reports having nasal congestion.  Denies cough or shortness of breath.  Denies chest pain or dizziness.  Denies dysuria.  No additional history could be obtained from her.  ED Course: Afebrile. Not tachycardic. Not hypoxic. Blood pressure elevated with systolic in the 160s to 170s. WBC 4.3, hemoglobin 13.2, hematocrit 39.1, and platelet count 179. Sodium 123, potassium 4.1, chloride 90, bicarb 23, BUN 13, creatinine 0.9, and glucose 119. Urine sodium 60. Serum sodium was 134 on labs done 2 days ago. UA not suggestive of infection.  Chest x-ray showing no acute cardiopulmonary disease.  Patient was given Tylenol, Zofran, 1 L normal saline bolus, and infusion of casirivimab-imdevimab in the ED.  Review of Systems:  All systems reviewed and apart from history of presenting illness, are negative.  Past Medical History:  Diagnosis Date  . Arthritis   . Back pain   . H/O degenerative disc disease     History reviewed. No pertinent surgical history.   reports that she has never smoked. She has never used smokeless tobacco. She  reports that she does not drink alcohol. No history on file for drug use.  Allergies  Allergen Reactions  . Erythromycin Swelling  . Tetracyclines & Related Swelling    History reviewed. No pertinent family history.  Prior to Admission medications   Medication Sig Start Date End Date Taking? Authorizing Provider  acyclovir (ZOVIRAX) 200 MG capsule Take 200 mg by mouth daily.  10/22/19   [provider]  alendronate (FOSAMAX) 70 MG tablet Take 70 mg by mouth once a week. 08/01/19   [provider]  amitriptyline (ELAVIL) 25 MG tablet Take 25 mg by mouth at bedtime.  10/22/19   [provider]  baclofen (LIORESAL) 10 MG tablet Take 10 mg by mouth 2 (two) times daily.  10/21/19   [provider]  brimonidine (ALPHAGAN) 0.2 % ophthalmic solution Place 1 drop into both eyes in the morning and at bedtime.  10/21/19   [provider]  fentaNYL (DURAGESIC) 25 MCG/HR Place 1 patch onto the skin every 3 (three) days.  10/13/19   [provider]  gabapentin (NEURONTIN) 400 MG capsule Take 400-800 mg by mouth 2 (two) times daily. Take 2 capsules (800 mg) in the morning and Take 1 capsule (400 mg) at bedtime 09/20/19   [provider]  HYDROcodone-acetaminophen (NORCO) 10-325 MG tablet Take 1 tablet by mouth 3 (three) times daily as needed for moderate pain or severe pain.  10/13/19   [provider]  levothyroxine (SYNTHROID) 88 MCG tablet Take 88 mcg by mouth daily. 08/23/19   [provider]  lovastatin (MEVACOR) 10 MG tablet Take 10 mg by mouth daily. 10/21/19   [provider]    Physical Exam: Vitals:   10/27/19 1426 10/27/19 1530 10/27/19 1545 10/27/19 1938  BP: (!) 164/88 (!) 162/91 (!) 177/77   Pulse: (!) 58 66 62   Resp: (!) 22 (!) 22 16   Temp:  99.2 F (37.3 C) 98.6 F (37 C)   TempSrc:  Oral Oral   SpO2: 100% 99% 100%   Weight:    60.7 kg  Height:    5\' 2"  (1.575 m)    Physical Exam Constitutional:       General: She is not in acute distress. HENT:     Head: Normocephalic and atraumatic.     Mouth/Throat:     Mouth: Mucous membranes are dry.     Pharynx: Oropharynx is clear.  Eyes:     Extraocular Movements: Extraocular movements intact.     Conjunctiva/sclera: Conjunctivae normal.  Cardiovascular:     Rate and Rhythm: Normal rate and regular rhythm.     Pulses: Normal pulses.  Pulmonary:     Effort: Pulmonary effort is normal. No respiratory distress.     Breath sounds: Normal breath sounds. No wheezing or rales.  Abdominal:     General: Bowel sounds are normal. There is no distension.     Palpations: Abdomen is soft.     Tenderness: There is no abdominal tenderness. There is no guarding or rebound.  Musculoskeletal:        General: No swelling or tenderness.     Cervical back: Normal range of motion and neck supple.  Skin:    General: Skin is warm and dry.  Neurological:     General: No focal deficit present.     Mental Status: She is alert.     Comments: Speech fluent, tongue midline, no facial droop Strength 5 out of 5 in bilateral upper and lower extremities. Sensation to light touch intact throughout. Appears slightly confused. Oriented to person place only.     Labs on Admission: I have personally reviewed following labs and imaging studies  CBC: Recent Labs  Lab 10/25/19 1847 10/27/19 1430  WBC 6.3 4.3  NEUTROABS 3.3 2.7  HGB 12.0 13.2  HCT 37.1 39.1  MCV 95.4 91.8  PLT 218 179   Basic Metabolic Panel: Recent Labs  Lab 10/25/19 1847 10/27/19 1430  NA 134* 123*  K 4.3 4.1  CL 94* 90*  CO2 28 23  GLUCOSE 91 119*  BUN 29* 13  CREATININE 1.04* 0.94  CALCIUM 8.7* 8.6*   GFR: Estimated Creatinine Clearance: 43 mL/min (by C-G formula based on SCr of 0.94 mg/dL). Liver Function Tests: Recent Labs  Lab 10/25/19 1847  AST 59*  ALT 42  ALKPHOS 60  BILITOT 0.4  PROT 7.6  ALBUMIN 3.5   No results for input(s): LIPASE, AMYLASE in the last 168  hours. Recent Labs  Lab 10/25/19 1847  AMMONIA 30   Coagulation Profile: No results for input(s): INR, PROTIME in the last 168 hours. Cardiac Enzymes: No results for input(s): CKTOTAL, CKMB, CKMBINDEX, TROPONINI in the last 168 hours. BNP (last 3 results) No results for input(s): PROBNP in the last 8760 hours. HbA1C: No results for input(s): HGBA1C in the last 72 hours. CBG: Recent Labs  Lab 10/25/19 1809  GLUCAP 98   Lipid Profile: No results for input(s): CHOL, HDL, LDLCALC, TRIG, CHOLHDL, LDLDIRECT in the last 72 hours. Thyroid Function Tests: No results for input(s):  TSH, T4TOTAL, FREET4, T3FREE, THYROIDAB in the last 72 hours. Anemia Panel: No results for input(s): VITAMINB12, FOLATE, FERRITIN, TIBC, IRON, RETICCTPCT in the last 72 hours. Urine analysis:    Component Value Date/Time   COLORURINE YELLOW 10/27/2019 1603   APPEARANCEUR CLEAR 10/27/2019 1603   LABSPEC 1.010 10/27/2019 1603   PHURINE 8.0 10/27/2019 1603   GLUCOSEU NEGATIVE 10/27/2019 1603   HGBUR NEGATIVE 10/27/2019 1603   BILIRUBINUR NEGATIVE 10/27/2019 1603   KETONESUR NEGATIVE 10/27/2019 1603   PROTEINUR NEGATIVE 10/27/2019 1603   NITRITE NEGATIVE 10/27/2019 1603   LEUKOCYTESUR NEGATIVE 10/27/2019 1603    Radiological Exams on Admission: DG Chest Portable 1 View  Result Date: 10/27/2019 CLINICAL DATA:  79 year old patient who is positive for COVID-19, presenting with shortness of breath, fever and confusion over the past 3 days. EXAM: PORTABLE CHEST 1 VIEW COMPARISON:  10/25/2019 and earlier. FINDINGS: Cardiac silhouette normal in size, unchanged. Prominent interstitial markings diffusely are unchanged when compared to prior x-rays in 2018 and are therefore chronic. Lungs otherwise clear. No localized airspace consolidation. No pleural effusions. No pneumothorax. Normal pulmonary vascularity. IMPRESSION: No acute cardiopulmonary disease. Electronically Signed   By: Hulan Saas M.D.   On: 10/27/2019  14:57    Assessment/Plan Principal Problem:   Hyponatremia Active Problems:   Lab test positive for detection of COVID-19 virus   Intractable nausea and vomiting   Elevated blood pressure reading   HLD (hyperlipidemia)   Acute hyponatremia: Likely due to poor oral intake in setting of intractable nausea and vomiting. Sodium currently 123, was 134 on labs done 2 days ago. Urine sodium 60. Patient has some slight confusion at this time.  No seizures reported.  Appears dehydrated on exam.  Neuro exam nonfocal. -Continue IV fluid hydration with normal saline. Monitor BMP every 4 hours and adjust rate of IV fluid accordingly. Goal rate of correction 4 to 6 mEq in a 24 hour period. Check serum osmolarity and urine osmolarity.  COVID-19 positive: Patient is fully vaccinated against Covid including booster shot on 8/30. She tested positive for Covid during ED visit on 8/30. Currently afebrile. Satting 99-100% on room air. Chest x-ray not suggestive of pneumonia. Blood cultures drawn 8/30 showing NGTD. -Status post casirivimab-imdevimab on 9/1 -Will hold off starting steroid and remdesivir at this time. -Check inflammatory markers including ferritin, fibrinogen, D-dimer, CRP, LDH -Check procalcitonin level -Airborne and contact precautions -Continuous pulse ox, supplemental oxygen if needed  Intractable nausea and vomiting: AST mildly elevated 59, remainder of LFTs normal on labs done 8/30. Abdominal exam benign. Patient tested positive for Covid 2 days ago but has been fully vaccinated including booster shot 2 days ago. -Zofran as needed for nausea/vomiting. Check lipase level and repeat LFTs.  Elevated blood pressure readings: No documented history of hypertension and not on any antihypertensives at home. -IV hydralazine PRN SBP >170. Continue to monitor blood pressure very closely.  Hyperlipidemia -Resume home statin after pharmacy med rec is done  Hypothyroidism -Resume home Synthroid  after pharmacy med rec is done  DVT prophylaxis: Lovenox Code Status: Full code Family Communication: No family available at this time. Disposition Plan: Status is: Inpatient  Remains inpatient appropriate because:IV treatments appropriate due to intensity of illness or inability to take PO and Inpatient level of care appropriate due to severity of illness   Dispo: The patient is from: Home              Anticipated d/c is to: Home  Anticipated d/c date is: 2 days              Patient currently is not medically stable to d/c.  The medical decision making on this patient was of high complexity and the patient is at high risk for clinical deterioration, therefore this is a level 3 visit.  John Giovanni MD [facility]  If 7PM-7AM, please contact night-coverage [email or web address]  10/27/2019, 8:14 PM

## 2019-10-28 ENCOUNTER — Inpatient Hospital Stay (HOSPITAL_COMMUNITY): Payer: Medicare (Managed Care)

## 2019-10-28 DIAGNOSIS — E785 Hyperlipidemia, unspecified: Secondary | ICD-10-CM

## 2019-10-28 DIAGNOSIS — G9341 Metabolic encephalopathy: Secondary | ICD-10-CM

## 2019-10-28 DIAGNOSIS — Z9189 Other specified personal risk factors, not elsewhere classified: Secondary | ICD-10-CM

## 2019-10-28 DIAGNOSIS — M069 Rheumatoid arthritis, unspecified: Secondary | ICD-10-CM

## 2019-10-28 DIAGNOSIS — R030 Elevated blood-pressure reading, without diagnosis of hypertension: Secondary | ICD-10-CM

## 2019-10-28 DIAGNOSIS — R112 Nausea with vomiting, unspecified: Secondary | ICD-10-CM

## 2019-10-28 DIAGNOSIS — E039 Hypothyroidism, unspecified: Secondary | ICD-10-CM

## 2019-10-28 DIAGNOSIS — U071 COVID-19: Principal | ICD-10-CM

## 2019-10-28 LAB — BASIC METABOLIC PANEL
Anion gap: 12 (ref 5–15)
Anion gap: 12 (ref 5–15)
BUN: 8 mg/dL (ref 8–23)
BUN: 9 mg/dL (ref 8–23)
CO2: 19 mmol/L — ABNORMAL LOW (ref 22–32)
CO2: 21 mmol/L — ABNORMAL LOW (ref 22–32)
Calcium: 8.2 mg/dL — ABNORMAL LOW (ref 8.9–10.3)
Calcium: 8.2 mg/dL — ABNORMAL LOW (ref 8.9–10.3)
Chloride: 94 mmol/L — ABNORMAL LOW (ref 98–111)
Chloride: 96 mmol/L — ABNORMAL LOW (ref 98–111)
Creatinine, Ser: 0.65 mg/dL (ref 0.44–1.00)
Creatinine, Ser: 0.69 mg/dL (ref 0.44–1.00)
GFR calc Af Amer: >60 mL/min (ref >60)
GFR calc Af Amer: >60 mL/min (ref >60)
GFR calc non Af Amer: >60 mL/min (ref >60)
GFR calc non Af Amer: >60 mL/min (ref >60)
Glucose, Bld: 122 mg/dL — ABNORMAL HIGH (ref 70–99)
Glucose, Bld: 128 mg/dL — ABNORMAL HIGH (ref 70–99)
Potassium: 3.5 mmol/L (ref 3.5–5.1)
Potassium: 3.6 mmol/L (ref 3.5–5.1)
Sodium: 127 mmol/L — ABNORMAL LOW (ref 135–145)
Sodium: 127 mmol/L — ABNORMAL LOW (ref 135–145)

## 2019-10-28 LAB — TSH: TSH: 4.298 u[IU]/mL (ref 0.350–4.500)

## 2019-10-28 LAB — BRAIN NATRIURETIC PEPTIDE: B Natriuretic Peptide: 90.3 pg/mL (ref 0.0–100.0)

## 2019-10-28 LAB — OSMOLALITY, URINE: Osmolality, Ur: 386 mOsm/kg (ref 300–900)

## 2019-10-28 LAB — OSMOLALITY: Osmolality: 263 mOsm/kg — ABNORMAL LOW (ref 275–295)

## 2019-10-28 LAB — AMMONIA: Ammonia: 22 umol/L (ref 9–35)

## 2019-10-28 LAB — VITAMIN B12: Vitamin B-12: 4887 pg/mL — ABNORMAL HIGH (ref 180–914)

## 2019-10-28 MED ORDER — AMLODIPINE BESYLATE 5 MG PO TABS
5.0000 mg | ORAL_TABLET | Freq: Every day | ORAL | Status: DC
  Administered 2019-10-28: 5 mg via ORAL
  Filled 2019-10-28: qty 1

## 2019-10-28 MED ORDER — OXYMETAZOLINE HCL 0.05 % NA SOLN
1.0000 | Freq: Two times a day (BID) | NASAL | Status: DC | PRN
  Administered 2019-10-28 – 2019-10-29 (×3): 1 via NASAL
  Filled 2019-10-28: qty 15

## 2019-10-28 MED ORDER — SODIUM CHLORIDE 0.9 % IV SOLN: INTRAVENOUS | Status: DC

## 2019-10-28 MED ORDER — PHENOL 1.4 % MT LIQD
1.0000 | OROMUCOSAL | Status: DC | PRN
  Filled 2019-10-28: qty 177

## 2019-10-28 MED ORDER — OXYCODONE HCL 5 MG PO TABS
5.0000 mg | ORAL_TABLET | Freq: Three times a day (TID) | ORAL | Status: DC | PRN
  Administered 2019-10-29 – 2019-10-30 (×4): 5 mg via ORAL
  Filled 2019-10-28 (×4): qty 1

## 2019-10-28 MED ORDER — AMITRIPTYLINE HCL 25 MG PO TABS
25.0000 mg | ORAL_TABLET | Freq: Every day | ORAL | Status: DC
  Administered 2019-10-28 – 2019-10-29 (×2): 25 mg via ORAL
  Filled 2019-10-28 (×2): qty 1

## 2019-10-28 MED ORDER — ACETAMINOPHEN 500 MG PO TABS
1000.0000 mg | ORAL_TABLET | Freq: Three times a day (TID) | ORAL | Status: DC
  Administered 2019-10-28 – 2019-10-29 (×5): 1000 mg via ORAL
  Filled 2019-10-28 (×5): qty 2

## 2019-10-28 MED ORDER — LEVOTHYROXINE SODIUM 88 MCG PO TABS
88.0000 ug | ORAL_TABLET | Freq: Every day | ORAL | Status: DC
  Administered 2019-10-28 – 2019-10-29 (×2): 88 ug via ORAL
  Filled 2019-10-28 (×2): qty 1

## 2019-10-28 MED ORDER — BRIMONIDINE TARTRATE 0.2 % OP SOLN
1.0000 [drp] | Freq: Two times a day (BID) | OPHTHALMIC | Status: DC
  Administered 2019-10-28 – 2019-10-30 (×5): 1 [drp] via OPHTHALMIC
  Filled 2019-10-28: qty 5

## 2019-10-28 MED ORDER — GABAPENTIN 100 MG PO CAPS
200.0000 mg | ORAL_CAPSULE | Freq: Two times a day (BID) | ORAL | Status: DC
  Administered 2019-10-28 – 2019-10-30 (×5): 200 mg via ORAL
  Filled 2019-10-28 (×5): qty 2

## 2019-10-28 NOTE — Progress Notes (Signed)
Nina White, No Pcp Per  White is from: Home  DOA: 10/27/2019 LOS: 1  Brief Narrative / Interim history: 79 year old female with history of rheumatoid arthritis on Remicade, hypothyroidism, hyperlipidemia and chronic pain on opiates presenting with altered mental status and dizziness.  White had altered mental status the morning of 10/25/2019 after she left hip orthopedic doctor's office.  She the had Modernabooster shot the same day.  She spiked fever after the vaccination and went to the ED.  She tested positive for COVID-19 but discharged home as she did not have respiratory distress or oxygen requirement.  Blood cultures drawn at the same time were NGTD.  White return to ED as she continued to have fevers, confusion and vomiting.  In ED, vitals within normal except for elevated BP.  100% on room air.  CBC without significant finding. Na 126 (134 two days prior).  Urine Na 60.  UA and CXR without acute finding.  White received 1 L normal saline bolus, Tylenol, Zofran and monoclonal antibody infusion, and admitted for hyponatremia and encephalopathy.  Subjective: Seen and examined earlier this morning.  No major events overnight or this morning.  No complaints but likes to go home.  She denies chest pain, dyspnea, nausea, vomiting, abdominal pain or UTI symptoms.  She is oriented to self, place and month.  Objective: Vitals:   10/28/19 0723 10/28/19 0754 10/28/19 0817 10/28/19 1058  BP: (!) 158/119 (!) 177/96  106/82  Pulse: 83 94    Resp: 16 18    Temp: 98.5 F (36.9 C) 98.1 F (36.7 C)    TempSrc: Oral Oral    SpO2: 100% 100% 99%   Weight:      Height:        Intake/Output Summary (Last 24 hours) at 10/28/2019 1210 Last data filed at 10/28/2019 0845 Gross per 24 hour  Intake 2015.84 ml  Output 830 ml  Net 1185.84 ml   Filed Weights   10/27/19 1248 10/27/19 1938 10/27/19 2146  Weight: 59 kg 60.7 kg 57.4  kg    Examination:  GENERAL: No apparent distress.  Nontoxic. HEENT: MMM.  Vision and hearing grossly intact.  NECK: Supple.  No apparent JVD.  RESP: On RA.  No IWOB.  Fair aeration bilaterally. CVS:  RRR. Heart sounds normal.  ABD/GI/GU: BS+. Abd soft, NTND.  MSK/EXT:  Moves extremities. No apparent deformity. No edema.  SKIN: no apparent skin lesion or wound NEURO: Awake, alert and oriented self, place, family and months.  No apparent focal neuro deficit. PSYCH: Calm. Normal affect.  Procedures:  None  Microbiology summarized: COVID-19 PCR positive.  Assessment & Plan: Acute metabolic encephalopathy-likely a combination of polypharmacy, dehydration and COVID-19 infection.  She was on baclofen, gabapentin, amitriptyline, high-dose Norco and fentanyl.  She had nausea and vomiting for 2 days prior to admission.  She also tested positive for COVID-19 incidentally.  Has acute hyponatremia but her Na was normal prior to onset of confusion.  No apparent focal neuro deficit to suggest CVA.  Recent blood cultures NGTD.  No seizure-like activities.  Overall, mental status seems to have improved.  She is now oriented to self, place, family and the month -Resume home gabapentin and opiate at reduced dose to prevent withdrawal symptoms. -Reorientation and delirium precautions. -Continue IV fluid hydration -PT/OT eval  Acute hyponatremia: Na 123 (admit)>>> 127. Na was 134 two days prior to admission. Urine Na 60 that suggested some  element of SIADH in addition to dehydration. -Continue IV NS for the next 24 hours. -Monitor sodium.  COVID-19 positive: Incidental finding.  She is vaccinated including booster that she received on 10/25/2019.  Saturating at 100% on RA.  She had some cough per her son.  CRP 1.8.  No acute finding on CXR. -Received monoclonal antibodies. -Supportive care  Dehydration due to intractable nausea and vomiting: this could be due to COVID-19 or polypharmacy.  Abdominal  exam benign.  Lipase slightly elevated at 53 likely from dehydration.  LFT within normal.  Nausea and vomiting seems to have resolved. -Continue IV fluid and antiemetics  Uncontrolled hypertension: Normotensive this morning.  Not on antihypertensive medications at home. -As needed hydralazine -Discontinue amlodipine  Hypothyroidism: -Check TSH. -Continue home Synthroid  Rheumatoid arthritis/chronic back pain: Per White's son, receives Remicade -Scheduled Tylenol with as needed oxycodone and gabapentin at reduced dose  Hyperlipidemia -Continue home statin  At risk for polypharmacy-on amitriptyline, baclofen, gabapentin, fentanyl and Norco.  Dangerous combination. -Adjusted pain medication as above   Body mass index is 23.15 kg/m.         DVT prophylaxis:  enoxaparin (LOVENOX) injection 40 mg Start: 10/27/19 2200  Code Status: Full code Family Communication: Updated White's son over the phone. Status is: Inpatient  Remains inpatient appropriate because:Persistent severe electrolyte disturbances, Altered mental status, Unsafe d/c plan, IV treatments appropriate due to intensity of illness or inability to take PO and Inpatient level of care appropriate due to severity of illness   Dispo: The White is from: Home              Anticipated d/c is to: Home              Anticipated d/c date is: 1 day              White currently is not medically stable to d/c.       Consultants:  None   Sch Meds:  Scheduled Meds:  amLODipine  5 mg Oral Daily   brimonidine  1 drop Both Eyes BID   enoxaparin (LOVENOX) injection  40 mg Subcutaneous Q24H   levothyroxine  88 mcg Oral Q0600   Continuous Infusions:  sodium chloride     sodium chloride 100 mL/hr at 10/28/19 0810   PRN Meds:.sodium chloride, acetaminophen **OR** acetaminophen, hydrALAZINE, lip balm, ondansetron (ZOFRAN) IV  Antimicrobials: Anti-infectives (From admission, onward)   None       I have  personally reviewed the following labs and images: CBC: Recent Labs  Lab 10/25/19 1847 10/27/19 1430  WBC 6.3 4.3  NEUTROABS 3.3 2.7  HGB 12.0 13.2  HCT 37.1 39.1  MCV 95.4 91.8  PLT 218 179   BMP &GFR Recent Labs  Lab 10/25/19 1847 10/27/19 1430 10/27/19 2040 10/28/19 0006 10/28/19 0339  NA 134* 123* 128* 127* 127*  K 4.3 4.1 4.2 3.6 3.5  CL 94* 90* 94* 94* 96*  CO2 28 23 21* 21* 19*  GLUCOSE 91 119* 120* 128* 122*  BUN 29* 13 9 9 8   CREATININE 1.04* 0.94 0.74 0.69 0.65  CALCIUM 8.7* 8.6* 8.5* 8.2* 8.2*   Estimated Creatinine Clearance: 46.6 mL/min (by C-G formula based on SCr of 0.65 mg/dL). Liver & Pancreas: Recent Labs  Lab 10/25/19 1847 10/27/19 2040  AST 59* 53*  ALT 42 35  ALKPHOS 60 58  BILITOT 0.4 0.6  PROT 7.6 7.6  ALBUMIN 3.5 3.4*   Recent Labs  Lab 10/27/19 2040  LIPASE  46   Recent Labs  Lab 10/25/19 1847  AMMONIA 30   Diabetic: No results for input(s): HGBA1C in the last 72 hours. Recent Labs  Lab 10/25/19 1809  GLUCAP 98   Cardiac Enzymes: No results for input(s): CKTOTAL, CKMB, CKMBINDEX, TROPONINI in the last 168 hours. No results for input(s): PROBNP in the last 8760 hours. Coagulation Profile: No results for input(s): INR, PROTIME in the last 168 hours. Thyroid Function Tests: No results for input(s): TSH, T4TOTAL, FREET4, T3FREE, THYROIDAB in the last 72 hours. Lipid Profile: Recent Labs    10/27/19 2040  TRIG 81   Anemia Panel: Recent Labs    10/27/19 2040  FERRITIN 73   Urine analysis:    Component Value Date/Time   COLORURINE YELLOW 10/27/2019 1603   APPEARANCEUR CLEAR 10/27/2019 1603   LABSPEC 1.010 10/27/2019 1603   PHURINE 8.0 10/27/2019 1603   GLUCOSEU NEGATIVE 10/27/2019 1603   HGBUR NEGATIVE 10/27/2019 1603   BILIRUBINUR NEGATIVE 10/27/2019 1603   KETONESUR NEGATIVE 10/27/2019 1603   PROTEINUR NEGATIVE 10/27/2019 1603   NITRITE NEGATIVE 10/27/2019 1603   LEUKOCYTESUR NEGATIVE 10/27/2019 1603    Sepsis Labs: Invalid input(s): PROCALCITONIN, LACTICIDVEN  Microbiology: Recent Results (from the past 240 hour(s))  Blood culture (routine x 2)     Status: None (Preliminary result)   Collection Time: 10/25/19  6:45 PM   Specimen: BLOOD LEFT WRIST  Result Value Ref Range Status   Specimen Description   Final    BLOOD LEFT WRIST Performed at [facility], [address] [place], [place] [address]    Special Requests   Final    BOTTLES DRAWN AEROBIC AND ANAEROBIC Blood Culture adequate volume Performed at [facility], [address] [place], [place] [address]    Culture   Final    NO GROWTH 3 DAYS Performed at [facility], [address] [place], [place] [address]    Report Status PENDING  Incomplete  SARS Coronavirus 2 by RT PCR (hospital order, performed in [facility] hospital lab) Nasopharyngeal Nasopharyngeal Swab     Status: Abnormal   Collection Time: 10/25/19  6:47 PM   Specimen: Nasopharyngeal Swab  Result Value Ref Range Status   SARS Coronavirus 2 POSITIVE (A) NEGATIVE Final    Comment: RESULT CALLED TO, READ BACK BY AND VERIFIED WITH: TALKINGTON,J. RN @2021  8/30/21BILLINGSLEY,L (NOTE) SARS-CoV-2 target nucleic acids are DETECTED  SARS-CoV-2 RNA is generally detectable in upper respiratory specimens  during the acute phase of infection.  Positive results are indicative  of the presence of the identified virus, but do not rule out bacterial infection or co-infection with other pathogens not detected by the test.  Clinical correlation with White history and  other diagnostic information is necessary to determine White infection status.  The expected result is negative.  Fact Sheet for Patients:   10/27/19   Fact Sheet for Healthcare Providers:   BoilerBrush.com.cy    This test is not yet approved or cleared by the https://pope.com/ FDA and  has been authorized for  detection and/or diagnosis of SARS-CoV-2 by FDA under an Emergency Use Authorization (EUA).  This EUA will remain in effect (meanin g this test can be used) for the duration of  the COVID-19 declaration under Section 564(b)(1) of the Act, 21 U.S.C. section 360-bbb-3(b)(1), unless the authorization is terminated or revoked sooner.  Performed at [facility], [address] [place], [place]   Urine culture  Status: Abnormal   Collection Time: 10/25/19  7:44 PM   Specimen: Urine, Clean Catch  Result Value Ref Range Status   Specimen Description   Final    URINE, CLEAN CATCH Performed at [facility], [address] [place], [place] [address]    Special Requests   Final    NONE Performed at [facility], [address], [place], [place] [address]    Culture MULTIPLE SPECIES PRESENT, SUGGEST RECOLLECTION (A)  Final   Report Status 10/26/2019 FINAL  Final  Blood culture (routine x 2)     Status: None (Preliminary result)   Collection Time: 10/25/19  7:44 PM   Specimen: BLOOD RIGHT WRIST  Result Value Ref Range Status   Specimen Description   Final    BLOOD RIGHT WRIST Performed at [facility], [address] [place], [place] [address]    Special Requests   Final    BOTTLES DRAWN AEROBIC AND ANAEROBIC Blood Culture adequate volume Performed at [facility], [address] [place], [place] [address]    Culture   Final    NO GROWTH 3 DAYS Performed at [facility], [address] [place], [place] [address]    Report Status PENDING  Incomplete    Radiology Studies: DG Chest Portable 1 View  Result Date: 10/27/2019 CLINICAL DATA:  79 year old White who is positive for COVID-19, presenting with shortness of breath, fever and confusion over the past 3 days. EXAM: PORTABLE CHEST 1 VIEW COMPARISON:  10/25/2019 and earlier. FINDINGS: Cardiac silhouette normal in size, unchanged.  Prominent interstitial markings diffusely are unchanged when compared to prior x-rays in 2018 and are therefore chronic. Lungs otherwise clear. No localized airspace consolidation. No pleural effusions. No pneumothorax. Normal pulmonary vascularity. IMPRESSION: No acute cardiopulmonary disease. Electronically Signed   By: Hulan Saas M.D.   On: 10/27/2019 14:57      Shayda Kalka T. Zaleah Ternes [facility]  If 7PM-7AM, please contact night-coverage [email or web address] 10/28/2019, 12:10 PM

## 2019-10-29 DIAGNOSIS — E876 Hypokalemia: Secondary | ICD-10-CM

## 2019-10-29 LAB — RENAL FUNCTION PANEL
Albumin: 2.8 g/dL — ABNORMAL LOW (ref 3.5–5.0)
Albumin: 3.3 g/dL — ABNORMAL LOW (ref 3.5–5.0)
Anion gap: 8 (ref 5–15)
Anion gap: 7 (ref 5–15)
BUN: 5 mg/dL — ABNORMAL LOW (ref 8–23)
BUN: 7 mg/dL — ABNORMAL LOW (ref 8–23)
CO2: 21 mmol/L — ABNORMAL LOW (ref 22–32)
CO2: 23 mmol/L (ref 22–32)
Calcium: 7.8 mg/dL — ABNORMAL LOW (ref 8.9–10.3)
Calcium: 8.1 mg/dL — ABNORMAL LOW (ref 8.9–10.3)
Chloride: 99 mmol/L (ref 98–111)
Chloride: 102 mmol/L (ref 98–111)
Creatinine, Ser: 0.61 mg/dL (ref 0.44–1.00)
Creatinine, Ser: 0.67 mg/dL (ref 0.44–1.00)
GFR calc Af Amer: >60 mL/min (ref >60)
GFR calc Af Amer: >60 mL/min (ref >60)
GFR calc non Af Amer: >60 mL/min (ref >60)
GFR calc non Af Amer: >60 mL/min (ref >60)
Glucose, Bld: 102 mg/dL — ABNORMAL HIGH (ref 70–99)
Glucose, Bld: 133 mg/dL — ABNORMAL HIGH (ref 70–99)
Phosphorus: 1.9 mg/dL — ABNORMAL LOW (ref 2.5–4.6)
Phosphorus: 2.8 mg/dL (ref 2.5–4.6)
Potassium: 3 mmol/L — ABNORMAL LOW (ref 3.5–5.1)
Potassium: 4.3 mmol/L (ref 3.5–5.1)
Sodium: 128 mmol/L — ABNORMAL LOW (ref 135–145)
Sodium: 132 mmol/L — ABNORMAL LOW (ref 135–145)

## 2019-10-29 LAB — MAGNESIUM
Magnesium: 1.5 mg/dL — ABNORMAL LOW (ref 1.7–2.4)
Magnesium: 2.3 mg/dL (ref 1.7–2.4)

## 2019-10-29 LAB — RPR: RPR Ser Ql: NONREACTIVE

## 2019-10-29 MED ORDER — SODIUM CHLORIDE 1 G PO TABS
1.0000 g | ORAL_TABLET | Freq: Three times a day (TID) | ORAL | Status: DC
  Administered 2019-10-29 – 2019-10-30 (×4): 1 g via ORAL
  Filled 2019-10-29 (×6): qty 1

## 2019-10-29 MED ORDER — POTASSIUM CHLORIDE CRYS ER 20 MEQ PO TBCR
40.0000 meq | EXTENDED_RELEASE_TABLET | ORAL | Status: DC
  Administered 2019-10-29: 40 meq via ORAL
  Filled 2019-10-29: qty 2

## 2019-10-29 MED ORDER — MAGNESIUM SULFATE 2 GM/50ML IV SOLN
2.0000 g | Freq: Once | INTRAVENOUS | Status: AC
  Administered 2019-10-29: 2 g via INTRAVENOUS
  Filled 2019-10-29: qty 50

## 2019-10-29 MED ORDER — PANTOPRAZOLE SODIUM 40 MG PO TBEC
40.0000 mg | DELAYED_RELEASE_TABLET | Freq: Every day | ORAL | Status: DC
  Administered 2019-10-29 – 2019-10-30 (×2): 40 mg via ORAL
  Filled 2019-10-29 (×2): qty 1

## 2019-10-29 MED ORDER — POTASSIUM PHOSPHATES 15 MMOLE/5ML IV SOLN
30.0000 mmol | Freq: Once | INTRAVENOUS | Status: AC
  Administered 2019-10-29: 30 mmol via INTRAVENOUS
  Filled 2019-10-29: qty 10

## 2019-10-29 NOTE — Progress Notes (Signed)
Pt is having a really difficult time with restless legs, unable to sleep/relax. bilat leg massage with heat packs & lotion by NT, & Oxy IR 5mg  given with calming sleep/relax music being played.

## 2019-10-29 NOTE — Evaluation (Signed)
Physical Therapy Evaluation Patient Details Name: Nina White MRN: [id number] DOB: 1941-01-30 Today's Date: 10/29/2019   History of Present Illness  Pt admitted on 10/28/19 after presented to the ED with complaints of N/V, dizziness and confusion x2 days. Patient son brought her to her doctor's appointment to get a Moderna booster on 8/30. The same day she developed a fever of 103 and tested positive for Covid during an ED visit.    Clinical Impression  Nina White is 79 y.o. female admitted with above HPI and diagnosis. Patient is currently limited by functional impairments below (see PT problem list). Patient is independent at baseline and currently requires supervision for transfers and gait. Patient will benefit from continued skilled PT interventions to address impairments and progress independence with mobility. Encouraged pt to mobilize in room with RN staff and call to mobilize to bathroom with staff to maintain/imptove independence and activity tolerance. Acute PT will follow and progress as able.     Follow Up Recommendations No PT follow up    Equipment Recommendations  None recommended by PT (TBA)    Recommendations for Other Services       Precautions / Restrictions Precautions Precautions: Fall Restrictions Weight Bearing Restrictions: No      Mobility  Bed Mobility Overal bed mobility: Independent        General bed mobility comments: No assist required, HOB slightly elevated for pt to sit up.   Transfers Overall transfer level: Modified independent Equipment used: None      General transfer comment: Pt Mod Independent using bed rail to rise and steady self. Pt using grab bar in bathroom to rise from toilet. No assist requried.   Ambulation/Gait Ambulation/Gait assistance: Supervision Gait Distance (Feet): 32 Feet Assistive device: None;IV Pole Gait Pattern/deviations: Step-through pattern;Decreased stride length Gait velocity: fair   General Gait  Details: cues for safe hand placement on IV pole, no overt LOB noted. Pt steady with turns in bathroom and around bed.   Stairs            Wheelchair Mobility    Modified Rankin (Stroke Patients Only)       Balance Overall balance assessment: Mild deficits observed, not formally tested                  Pertinent Vitals/Pain Pain Assessment: No/denies pain (pt does have chronic back pain but does not rate)    Home Living Family/patient expects to be discharged to:: Private residence Living Arrangements: Children Available Help at Discharge: Family Type of Home: House Home Access: Stairs to enter Entrance Stairs-Rails: None Secretary/administrator of Steps: 2 Home Layout: Multi-level Home Equipment: None      Prior Function Level of Independence: Independent         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   Dominant Hand: Right    Extremity/Trunk Assessment   Upper Extremity Assessment Upper Extremity Assessment: Overall WFL for tasks assessed    Lower Extremity Assessment Lower Extremity Assessment: Overall WFL for tasks assessed    Cervical / Trunk Assessment Cervical / Trunk Assessment: Normal  Communication   Communication: No difficulties  Cognition Arousal/Alertness: Awake/alert Behavior During Therapy: WFL for tasks assessed/performed Overall Cognitive Status: Within Functional Limits for tasks assessed     General Comments      Exercises     Assessment/Plan    PT Assessment Patient needs continued PT services  PT Problem List Decreased activity tolerance;Decreased balance;Decreased mobility;Decreased knowledge  of use of DME       PT Treatment Interventions DME instruction;Gait training;Stair training;Functional mobility training;Therapeutic activities;Therapeutic exercise;Balance training;Patient/family education    PT Goals (Current goals can be found in the Care Plan section)  Acute Rehab PT Goals Patient Stated  Goal: Home and resume previous activities. PT Goal Formulation: With patient Time For Goal Achievement: 11/05/19 Potential to Achieve Goals: Good    Frequency Min 3X/week   Barriers to discharge        AM-PAC PT "6 Clicks" Mobility  Outcome Measure Help needed turning from your back to your side while in a flat bed without using bedrails?: None Help needed moving from lying on your back to sitting on the side of a flat bed without using bedrails?: None Help needed moving to and from a bed to a chair (including a wheelchair)?: None Help needed standing up from a chair using your arms (e.g., wheelchair or bedside chair)?: None Help needed to walk in hospital room?: A Little Help needed climbing 3-5 steps with a railing? : A Little 6 Click Score: 22    End of Session Equipment Utilized During Treatment: Gait belt Activity Tolerance: Patient tolerated treatment well Patient left: in chair;with call bell/phone within reach   PT Visit Diagnosis: Muscle weakness (generalized) (M62.81);Unsteadiness on feet (R26.81)    Time: 4259-5638 PT Time Calculation (min) (ACUTE ONLY): 23 min   Charges:   PT Evaluation $PT Eval Low Complexity: 1 Low PT Treatments $Gait Training: 8-22 mins       Wynn Maudlin, DPT Acute Rehabilitation Services  Office [phone] Pager [phone]  10/29/2019 6:56 PM

## 2019-10-29 NOTE — Plan of Care (Signed)
Plan of care reviewed and discussed with the patient. 

## 2019-10-29 NOTE — Progress Notes (Signed)
PROGRESS NOTE  Nina White OEV:[id number] DOB: August 27, 1940   PCP: Patient, No Pcp Per  Patient is from: Home  DOA: 10/27/2019 LOS: 2  Brief Narrative / Interim history: 79 year old female with history of rheumatoid arthritis on Remicade, hypothyroidism, hyperlipidemia and chronic pain on opiates presenting with altered mental status and dizziness.  Patient had altered mental status the morning of 10/25/2019 after she left hip orthopedic doctor's office.  She the had Modernabooster shot the same day.  She spiked fever after the vaccination and went to the ED.  She tested positive for COVID-19 but discharged home as she did not have respiratory distress or oxygen requirement.  Blood cultures drawn at the same time were NGTD.  Patient return to ED as she continued to have fevers, confusion and vomiting.  In ED, vitals within normal except for elevated BP.  100% on room air.  CBC without significant finding. Na 126 (134 two days prior).  Urine Na 60.  UA and CXR without acute finding.  Patient received 1 L normal saline bolus, Tylenol, Zofran and monoclonal antibody infusion, and admitted for hyponatremia and encephalopathy.  Subjective: Seen and examined earlier this morning.  No major events overnight of this morning.  Reports nasal congestion and reflux.  No other complaints.  She denies cough, chest pain, shortness of breath, nausea, vomiting, abdominal pain or UTI symptoms.  Labs with hypokalemia, hypomagnesemia, hypophosphatemia and hyponatremia.  Objective: Vitals:   10/28/19 1337 10/28/19 1946 10/29/19 0406 10/29/19 1318  BP: 136/81 (!) 145/87 (!) 148/78 (!) 144/80  Pulse: 85 66 66 68  Resp: 16 20 17 18   Temp: 98.6 F (37 C) 98.7 F (37.1 C) 98.6 F (37 C) 98.5 F (36.9 C)  TempSrc: Oral     SpO2: 99% 96% 98% 100%  Weight:      Height:        Intake/Output Summary (Last 24 hours) at 10/29/2019 1443 Last data filed at 10/29/2019 0556 Gross per 24 hour  Intake 3406.98 ml   Output 825 ml  Net 2581.98 ml   Filed Weights   10/27/19 1248 10/27/19 1938 10/27/19 2146  Weight: 59 kg 60.7 kg 57.4 kg    Examination:  GENERAL: No apparent distress.  Nontoxic. HEENT: MMM.  Vision and hearing grossly intact.  NECK: Supple.  No apparent JVD.  RESP: On room air.  No IWOB.  Fair aeration bilaterally. CVS:  RRR. Heart sounds normal.  ABD/GI/GU: BS+. Abd soft, NTND.  MSK/EXT:  Moves extremities. No apparent deformity. No edema.  SKIN: no apparent skin lesion or wound NEURO: Awake, alert and oriented appropriately.  No apparent focal neuro deficit. PSYCH: Calm. Normal affect.  Procedures:  None  Microbiology summarized: COVID-19 PCR positive.  Assessment & Plan: Acute metabolic encephalopathy-likely a combination of polypharmacy, dehydration and COVID-19 infection.  She was on baclofen, gabapentin, amitriptyline, high-dose Norco and fentanyl.  She had nausea and vomiting for 2 days prior to admission.  She also tested positive for COVID-19 incidentally.  Has acute hyponatremia but her Na was normal prior to onset of confusion.  No apparent focal neuro deficit to suggest CVA.  Recent blood cultures NGTD.  No seizure-like activities.  Encephalopathy resolved. -Continue reduced dose gabapentin and opiate -Reorientation and delirium precautions. -Discontinue IV fluid. -PT/OT eval  Acute hyponatremia: Na 123 (admit)>>> 127> 128. Na was 134 two days prior to admission. 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 -Discontinue IV normal saline -Start p.o. sodium chloride  1 g 3 times daily -Monitor sodium.  COVID-19 positive: Incidental finding.  She is vaccinated including booster that she received on 10/25/2019.  Saturating at 100% on RA.  She had some cough per her son.  CRP 1.8.  No acute finding on CXR. -Received monoclonal antibodies. -Supportive care  Dehydration due to intractable nausea and vomiting: this could be due to  COVID-19 or polypharmacy.  Resolved.  -Discontinue IV fluid.  Uncontrolled hypertension: Normotensive this morning.  Not on antihypertensive medications at home. -As needed hydralazine -Discontinue amlodipine  Hypokalemia/hypophosphatemia/hypomagnesemia: Refeeding syndrome? -Replenish and recheck.  Hypothyroidism: TSH normal. -Continue home Synthroid  Rheumatoid arthritis/chronic back pain: Per patient's son, receives Remicade -Scheduled Tylenol with as needed oxycodone and gabapentin at reduced dose  Hyperlipidemia -Continue home statin  At risk for polypharmacy-on amitriptyline, baclofen, gabapentin, fentanyl and Norco.  Dangerous combination. -Adjusted pain medication as above   Body mass index is 23.15 kg/m.         DVT prophylaxis:  enoxaparin (LOVENOX) injection 40 mg Start: 10/27/19 2200  Code Status: Full code Family Communication: Updated patient's son over the phone. Status is: Inpatient  Remains inpatient appropriate because:Persistent severe electrolyte disturbances, IV treatments appropriate due to intensity of illness or inability to take PO and Inpatient level of care appropriate due to severity of illness Patient has significant electrolyte derangement including hyponatremia, hypokalemia, hypomagnesemia and hypophosphatemia.  Dispo: The patient is from: Home              Anticipated d/c is to: Home              Anticipated d/c date is: 1 day              Patient currently is not medically stable to d/c.       Consultants:  None   Sch Meds:  Scheduled Meds: . acetaminophen  1,000 mg Oral Q8H  . amitriptyline  25 mg Oral QHS  . brimonidine  1 drop Both Eyes BID  . enoxaparin (LOVENOX) injection  40 mg Subcutaneous Q24H  . gabapentin  200 mg Oral BID  . levothyroxine  88 mcg Oral Q0600  . pantoprazole  40 mg Oral Daily  . potassium chloride  40 mEq Oral Q4H  . sodium chloride  1 g Oral TID WC   Continuous Infusions: . sodium chloride     . potassium PHOSPHATE IVPB (in mmol) 30 mmol (10/29/19 1310)   PRN Meds:.sodium chloride, hydrALAZINE, ondansetron (ZOFRAN) IV, oxyCODONE, oxymetazoline, phenol  Antimicrobials: Anti-infectives (From admission, onward)   None       I have personally reviewed the following labs and images: CBC: Recent Labs  Lab 10/25/19 1847 10/27/19 1430  WBC 6.3 4.3  NEUTROABS 3.3 2.7  HGB 12.0 13.2  HCT 37.1 39.1  MCV 95.4 91.8  PLT 218 179   BMP &GFR Recent Labs  Lab 10/27/19 1430 10/27/19 2040 10/28/19 0006 10/28/19 0339 10/29/19 0340  NA 123* 128* 127* 127* 128*  K 4.1 4.2 3.6 3.5 3.0*  CL 90* 94* 94* 96* 99  CO2 23 21* 21* 19* 21*  GLUCOSE 119* 120* 128* 122* 102*  BUN 5*  CREATININE 0.94 0.74 0.69 0.65 0.61  CALCIUM 8.6* 8.5* 8.2* 8.2* 7.8*  MG  --   --   --   --  1.5*  PHOS  --   --   --   --  1.9*   Estimated Creatinine Clearance: 46.6 mL/min (by C-G formula based  on SCr of 0.61 mg/dL). Liver & Pancreas: Recent Labs  Lab 10/25/19 1847 10/27/19 2040 10/29/19 0340  AST 59* 53*  --   ALT 42 35  --   ALKPHOS 60 58  --   BILITOT 0.4 0.6  --   PROT 7.6 7.6  --   ALBUMIN 3.5 3.4* 2.8*   Recent Labs  Lab 10/27/19 2040  LIPASE 46   Recent Labs  Lab 10/25/19 1847 10/28/19 1500  AMMONIA 30 22   Diabetic: No results for input(s): HGBA1C in the last 72 hours. Recent Labs  Lab 10/25/19 1809  GLUCAP 98   Cardiac Enzymes: No results for input(s): CKTOTAL, CKMB, CKMBINDEX, TROPONINI in the last 168 hours. No results for input(s): PROBNP in the last 8760 hours. Coagulation Profile: No results for input(s): INR, PROTIME in the last 168 hours. Thyroid Function Tests: Recent Labs    10/28/19 1500  TSH 4.298   Lipid Profile: Recent Labs    10/27/19 2040  TRIG 81   Anemia Panel: Recent Labs    10/27/19 2040 10/28/19 1500  VITAMINB12  --  4,887*  FERRITIN 73  --    Urine analysis:    Component Value Date/Time   COLORURINE YELLOW  10/27/2019 1603   APPEARANCEUR CLEAR 10/27/2019 1603   LABSPEC 1.010 10/27/2019 1603   PHURINE 8.0 10/27/2019 1603   GLUCOSEU NEGATIVE 10/27/2019 1603   HGBUR NEGATIVE 10/27/2019 1603   BILIRUBINUR NEGATIVE 10/27/2019 1603   KETONESUR NEGATIVE 10/27/2019 1603   PROTEINUR NEGATIVE 10/27/2019 1603   NITRITE NEGATIVE 10/27/2019 1603   LEUKOCYTESUR NEGATIVE 10/27/2019 1603   Sepsis Labs: Invalid input(s): PROCALCITONIN, LACTICIDVEN  Microbiology: Recent Results (from the past 240 hour(s))  Blood culture (routine x 2)     Status: None (Preliminary result)   Collection Time: 10/25/19  6:45 PM   Specimen: BLOOD LEFT WRIST  Result Value Ref Range Status   Specimen Description   Final    BLOOD LEFT WRIST Performed at [facility], [address], [place], [place] [address]    Special Requests   Final    BOTTLES DRAWN AEROBIC AND ANAEROBIC Blood Culture adequate volume Performed at [facility], [address] [place], [place] [address]    Culture   Final    NO GROWTH 4 DAYS Performed at [facility], [address], [place], [place] [address]    Report Status PENDING  Incomplete  SARS Coronavirus 2 by RT PCR (hospital order, performed in [facility] hospital lab) Nasopharyngeal Nasopharyngeal Swab     Status: Abnormal   Collection Time: 10/25/19  6:47 PM   Specimen: Nasopharyngeal Swab  Result Value Ref Range Status   SARS Coronavirus 2 POSITIVE (A) NEGATIVE Final    Comment: RESULT CALLED TO, READ BACK BY AND VERIFIED WITH: TALKINGTON,J. RN @2021  8/30/21BILLINGSLEY,L (NOTE) SARS-CoV-2 target nucleic acids are DETECTED  SARS-CoV-2 RNA is generally detectable in upper respiratory specimens  during the acute phase of infection.  Positive results are indicative  of the presence of the identified virus, but do not rule out bacterial infection or co-infection with other pathogens not detected by the test.  Clinical correlation with patient history and   other diagnostic information is necessary to determine patient infection status.  The expected result is negative.  Fact Sheet for Patients:   10/27/19   Fact Sheet for Healthcare Providers:   BoilerBrush.com.cy    This test is not yet approved or cleared by the https://pope.com/  FDA and  has been authorized for detection and/or diagnosis of SARS-CoV-2 by FDA under an Emergency Use Authorization (EUA).  This EUA will remain in effect (meanin g this test can be used) for the duration of  the COVID-19 declaration under Section 564(b)(1) of the Act, 21 U.S.C. section 360-bbb-3(b)(1), unless the authorization is terminated or revoked sooner.  Performed at [facility], [address], [place], [place] [address]   Urine culture     Status: Abnormal   Collection Time: 10/25/19  7:44 PM   Specimen: Urine, Clean Catch  Result Value Ref Range Status   Specimen Description   Final    URINE, CLEAN CATCH Performed at [facility], [address] [place], [place] [address]    Special Requests   Final    NONE Performed at [facility], [address], [place], [place] [address]    Culture MULTIPLE SPECIES PRESENT, SUGGEST RECOLLECTION (A)  Final   Report Status 10/26/2019 FINAL  Final  Blood culture (routine x 2)     Status: None (Preliminary result)   Collection Time: 10/25/19  7:44 PM   Specimen: BLOOD RIGHT WRIST  Result Value Ref Range Status   Specimen Description   Final    BLOOD RIGHT WRIST Performed at [facility], [address], [place], [place] [address]    Special Requests   Final    BOTTLES DRAWN AEROBIC AND ANAEROBIC Blood Culture adequate volume Performed at [facility], [address] [place], [place] [address]    Culture   Final    NO GROWTH 4 DAYS Performed at [facility], [address], [place], [place] [address]     Report Status PENDING  Incomplete    Radiology Studies: No results found.    Ketrick Matney T. Amina Menchaca [facility]  If 7PM-7AM, please contact night-coverage [email or web address] 10/29/2019, 2:43 PM

## 2019-10-29 NOTE — Care Management Important Message (Signed)
Important Message  Patient Details IM Letter given to the Patient Name: Nina White MRN: [id number] Date of Birth: 01-07-1941   Medicare Important Message Given:  Yes     Caren Macadam 10/29/2019, 10:08 AM

## 2019-10-29 NOTE — Evaluation (Signed)
Occupational Therapy Evaluation Patient Details Name: Nina White MRN: [id number] DOB: Jun 30, 1940 Today's Date: 10/29/2019    History of Present Illness Pt admitted on 10/28/19 after presented to the ED with complaints of N/V, dizziness and confusion x2 days. Patient son brought her to her doctor's appointment to get a Moderna booster on 8/30. The same day she developed a fever of 103 and tested positive for Covid during an ED visit.   Clinical Impression   Patient evaluated by Occupational Therapy with no further acute OT needs identified. All education has been completed and the patient has no further questions. Pt is maintaining her SpO2 well in high 90s to 100% throughout session including ambulating around room, all on room air.  See below for any follow-up Occupational Therapy or equipment needs. OT is signing off. Thank you for this referral.      Follow Up Recommendations  No OT follow up    Equipment Recommendations       Recommendations for Other Services       Precautions / Restrictions Restrictions Weight Bearing Restrictions: No      Mobility Bed Mobility Overal bed mobility: Independent             General bed mobility comments: Supine to sit: Independent with HOB partially elevated.  Transfers Overall transfer level: Independent Equipment used: None             General transfer comment: Sit to stand from EOB: Independent. Ambulation around room Independent.    Balance Overall balance assessment: No apparent balance deficits (not formally assessed);Independent                                         ADL either performed or assessed with clinical judgement   ADL Overall ADL's : Modified independent                                       General ADL Comments: Pt able to demo socks doffed and donned, in-room ambuation without device, eating all independently. O2 sats remained in high 90s on RA. HR: 70s-80s per  tele     Vision   Vision Assessment?: No apparent visual deficits     Perception     Praxis      Pertinent Vitals/Pain Pain Assessment: 0-10 Pain Score: 5  Pain Location: Chronic back and hip pain from RA Pain Descriptors / Indicators: Aching Pain Intervention(s): Other (comment) (Pt reports that pain is in a tolerable rarnge.)     Hand Dominance     Extremity/Trunk Assessment Upper Extremity Assessment Upper Extremity Assessment: Overall WFL for tasks assessed   Lower Extremity Assessment Lower Extremity Assessment: Overall WFL for tasks assessed   Cervical / Trunk Assessment Cervical / Trunk Assessment: Normal   Communication Communication Communication: No difficulties   Cognition Arousal/Alertness: Awake/alert Behavior During Therapy: WFL for tasks assessed/performed Overall Cognitive Status: Within Functional Limits for tasks assessed                                     General Comments       Exercises     Shoulder Instructions      Home Living Family/patient expects to be discharged to:: Private  residence Living Arrangements: Children   Type of Home: House Home Access: Stairs to enter Secretary/administrator of Steps: 2 Entrance Stairs-Rails: None Home Layout: Multi-level Alternate Level Stairs-Number of Steps: Level entry to "main floor" (den and 1/2 bath). 4 steps to Living room/dining/kitchen.  8 steps from there to bedrooms and full bathrooms. Alternate Level Stairs-Rails: Left Bathroom Shower/Tub: Tub/shower unit;Walk-in shower         Home Equipment: None          Prior Functioning/Environment Level of Independence: Independent                 OT Problem List: Pain      OT Treatment/Interventions:      OT Goals(Current goals can be found in the care plan section) Acute Rehab OT Goals Patient Stated Goal: Home and resume previous activities. OT Goal Formulation: With patient Time For Goal Achievement:  11/12/19 Potential to Achieve Goals: Good  OT Frequency:     Barriers to D/C:            Co-evaluation              AM-PAC OT "6 Clicks" Daily Activity     Outcome Measure Help from another person eating meals?: None Help from another person taking care of personal grooming?: None Help from another person toileting, which includes using toliet, bedpan, or urinal?: None Help from another person bathing (including washing, rinsing, drying)?: None Help from another person to put on and taking off regular upper body clothing?: None Help from another person to put on and taking off regular lower body clothing?: None 6 Click Score: 24   End of Session    Activity Tolerance: Patient tolerated treatment well Patient left: in chair;with call bell/phone within reach  OT Visit Diagnosis:  (Decreased ADLS)                Time: 1336-1410 OT Time Calculation (min): 34 min Charges:  OT General Charges $OT Visit: 1 Visit OT Evaluation $OT Eval Low Complexity: 1 Low  Elyon Zoll, OT Acute Rehab Services Office: [phone] 10/29/2019   Theodoro Clock 10/29/2019, 2:35 PM

## 2019-10-30 DIAGNOSIS — G894 Chronic pain syndrome: Secondary | ICD-10-CM

## 2019-10-30 DIAGNOSIS — Z79899 Other long term (current) drug therapy: Secondary | ICD-10-CM

## 2019-10-30 LAB — RENAL FUNCTION PANEL
Albumin: 3 g/dL — ABNORMAL LOW (ref 3.5–5.0)
Anion gap: 7 (ref 5–15)
BUN: 6 mg/dL — ABNORMAL LOW (ref 8–23)
CO2: 22 mmol/L (ref 22–32)
Calcium: 8.3 mg/dL — ABNORMAL LOW (ref 8.9–10.3)
Chloride: 104 mmol/L (ref 98–111)
Creatinine, Ser: 0.65 mg/dL (ref 0.44–1.00)
GFR calc Af Amer: >60 mL/min (ref >60)
GFR calc non Af Amer: >60 mL/min (ref >60)
Glucose, Bld: 83 mg/dL (ref 70–99)
Phosphorus: 2.3 mg/dL — ABNORMAL LOW (ref 2.5–4.6)
Potassium: 6 mmol/L — ABNORMAL HIGH (ref 3.5–5.1)
Sodium: 133 mmol/L — ABNORMAL LOW (ref 135–145)

## 2019-10-30 LAB — CBC
HCT: 32.7 % — ABNORMAL LOW (ref 36.0–46.0)
Hemoglobin: 10.8 g/dL — ABNORMAL LOW (ref 12.0–15.0)
MCH: 30.9 pg (ref 26.0–34.0)
MCHC: 33 g/dL (ref 30.0–36.0)
MCV: 93.7 fL (ref 80.0–100.0)
Platelets: 165 10*3/uL (ref 150–400)
RBC: 3.49 MIL/uL — ABNORMAL LOW (ref 3.87–5.11)
RDW: 13.2 % (ref 11.5–15.5)
WBC: 5.6 10*3/uL (ref 4.0–10.5)
nRBC: 0 % (ref 0.0–0.2)

## 2019-10-30 LAB — CULTURE, BLOOD (ROUTINE X 2)
Culture: NO GROWTH
Culture: NO GROWTH
Special Requests: ADEQUATE
Special Requests: ADEQUATE

## 2019-10-30 LAB — MAGNESIUM: Magnesium: 2.2 mg/dL (ref 1.7–2.4)

## 2019-10-30 LAB — POTASSIUM: Potassium: 4.2 mmol/L (ref 3.5–5.1)

## 2019-10-30 MED ORDER — SENNOSIDES-DOCUSATE SODIUM 8.6-50 MG PO TABS: 1.0000 | ORAL_TABLET | Freq: Two times a day (BID) | ORAL | 0 refills | Status: AC | PRN

## 2019-10-30 MED ORDER — GABAPENTIN 400 MG PO CAPS
400.0000 mg | ORAL_CAPSULE | Freq: Two times a day (BID) | ORAL | Status: DC
Start: 2019-10-30 — End: 2019-10-30

## 2019-10-30 MED ORDER — GABAPENTIN 400 MG PO CAPS: 400.0000 mg | ORAL_CAPSULE | Freq: Two times a day (BID) | ORAL | Status: DC

## 2019-10-30 MED ORDER — SODIUM CHLORIDE 1 G PO TABS: 1.0000 g | ORAL_TABLET | Freq: Once | ORAL | 0 refills | Status: AC

## 2019-10-30 MED ORDER — ONDANSETRON HCL 4 MG PO TABS: 4.0000 mg | ORAL_TABLET | Freq: Three times a day (TID) | ORAL | 0 refills | Status: AC | PRN

## 2019-10-30 NOTE — Discharge Summary (Signed)
Physician Discharge Summary  DEMIYAH FISCHBACH WUJ:[id number] DOB: 05/26/1940 DOA: 10/27/2019  PCP: Leola Brazil, DO  Admit date: 10/27/2019 Discharge date: 10/30/2019  Admitted From: Home Disposition: Home  Recommendations for Outpatient Follow-up:  1. Follow ups as below. 2. Please obtain CBC/BMP/Mag at follow up 3. Please follow up on the following pending results: None  Home Health: None required Equipment/Devices: None required  Discharge Condition: Stable CODE STATUS: Full code   Follow-up Information    Leola Brazil, DO. Schedule an appointment as soon as possible for a visit in 1 week(s).   Specialty: Internal Medicine Contact information: [address] [place] [address] [phone]                Hospital Course: 79 year old female with history of rheumatoid arthritis on Remicade, hypothyroidism, hyperlipidemia and chronic pain on opiates presenting with altered mental status and dizziness.  Patient had altered mental status the morning of 10/25/2019 after she left hip orthopedic doctor's office.  She the had Modernabooster shot the same day.  She spiked fever after the vaccination and went to the ED.  She tested positive for COVID-19 but discharged home as she did not have respiratory distress or oxygen requirement.  Blood cultures drawn at the same time were NGTD.  Patient return to ED as she continued to have fevers, confusion and vomiting.  In ED, vitals within normal except for elevated BP.  100% on room air.  CBC without significant finding. Na 126 (134 two days prior).  Urine Na 60.  UA and CXR without acute finding.  Patient received 1 L normal saline bolus, Tylenol, Zofran and monoclonal antibody infusion, and admitted for hyponatremia, dehydration and encephalopathy.   Dehydration and encephalopathy resolved after holding home pain medications and IV normal saline hydration. Hyponatremia initially improved to 127 with IV  normal saline but did not go up further.  IV normal saline discontinued.  She was a started on p.o. sodium chloride.  His sodium improved to 133.  She was discharged on p.o. sodium chloride 1 g daily for the next 10 days until she has repeat labs at PCP.   Patient has been on high-dose Norco, fentanyl patch, gabapentin, baclofen and amitriptyline which puts her at significant risk for polypharmacy.  Initially we resumed gabapentin at lower dose.  We also started low-dose oxycodone as needed anesthesia for Norco.  I had extensive discussion with patient and patient's son about her risk of polypharmacy.  I discontinued baclofen and reduced gabapentin on discharge.  I have encouraged patient cut down on her Norco as much as possible.  I also encouraged patient and and patient's son to discuss about the pain medications with her PCP or prescribers.   Patient was evaluated by therapy and no need was identified.  See individual problem list below for more hospital course.  Discharge Diagnoses:  Acute metabolic encephalopathy-likely a combination of polypharmacy, dehydration and COVID-19 infection.  She was on baclofen, gabapentin, amitriptyline, high-dose Norco and fentanyl.  She had nausea and vomiting for 2 days prior to admission.  She also tested positive for COVID-19 incidentally.  Has acute hyponatremia but her Na was normal prior to onset of confusion.  No apparent focal neuro deficit to suggest CVA.  Recent blood cultures NGTD.  No seizure-like activities.  Encephalopathy resolved. -Discontinued baclofen and reduced gabapentin. -Strongly recommend reviewing her pain medications at follow-up.  Acute hyponatremia: Na 123 (admit)>>> 127> 128> 133. Na was 134 two days  prior to admission. 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 -Discharged on p.o. sodium chloride 1 g daily, #10 -Recheck BMP at follow-up  COVID-19 positive: Incidental finding.  She is vaccinated  including booster that she received on 10/25/2019.  Saturating at 100% on RA.  She had some cough per her son.  CRP 1.8.  No acute finding on CXR. -Received monoclonal antibodies. -Supportive care and precautions for the next 3 weeks  Dehydration due to intractable nausea and vomiting: this could be due to COVID-19 or polypharmacy.   She is on significant dose of opiates.  Resolved. -Encouraged cutting down on high pain medications/opiate -As needed Zofran and Senokot-S  Elevated blood pressure: Was hypertensive on presentation.  Remained normotensive after that.  Not on meds at home.  Hypokalemia/hypophosphatemia/hypomagnesemia: Refeeding syndrome?  Resolved. -Recheck at follow-up.    Hypothyroidism: TSH normal. -Continue home Synthroid  Rheumatoid arthritis/chronic back pain: Per patient's son, receives Remicade -Adjustment to home pain medications as above  Hyperlipidemia -Continue home statin  Polypharmacy-on amitriptyline, baclofen, gabapentin, fentanyl and Norco.  Dangerous combination. -Counseled and encouraged to discuss with the prescribers -Discontinued baclofen.  Reduce gabapentin.  Encouraged to cut down on her Norco. -Consider rehab    Body mass index is 23.15 kg/m.            Discharge Exam: Vitals:   10/30/19 0400 10/30/19 0547  BP:  (!) 162/80  Pulse: (!) 53 (!) 57  Resp: 18 18  Temp:  97.7 F (36.5 C)  SpO2: 96% 99%    GENERAL: No apparent distress.  Nontoxic. HEENT: MMM.  Vision and hearing grossly intact.  NECK: Supple.  No apparent JVD.  RESP:  No IWOB.  Fair aeration bilaterally. CVS:  RRR. Heart sounds normal.  ABD/GI/GU: Bowel sounds present. Soft. Non tender.  MSK/EXT:  Moves extremities. No apparent deformity. No edema.  SKIN: no apparent skin lesion or wound NEURO: Awake, alert and oriented appropriately.  No apparent focal neuro deficit. PSYCH: Calm. Normal affect.  Discharge Instructions  Discharge Instructions    Call  MD for:  difficulty breathing, headache or visual disturbances   Complete by: As directed    Call MD for:  extreme fatigue   Complete by: As directed    Call MD for:  persistant dizziness or light-headedness   Complete by: As directed    Call MD for:  persistant nausea and vomiting   Complete by: As directed    Diet general   Complete by: As directed    Discharge instructions   Complete by: As directed    It has been a pleasure taking care of you!  You were hospitalized due to altered mental status, low sodium level and dehydration. Your mental status and sodium level has improved. Your dehydration has resolved. We are very concerned that your confusion/mental status change was mainly due to your medications including hydrocodone, fentanyl, gabapentin, baclofen and amitriptyline. We have stopped your baclofen. We have reduced his gabapentin to 400 mg. We strongly recommend you discuss your medication with your primary care doctor prescribers so they can slowly wean you off. Meanwhile, we strongly recommend not taking your hydrocodone unless you have significant pain. You may try just half a tablet instead of a full tablet until you talk to your doctors.    We may have started you on other new medications or made some changes to your home medications during this hospitalization. Please review your new medication list and  the directions carefully before you take them.    Please go to your hospital follow-up appointments or call to schedule as recommended.   Take care,   Increase activity slowly   Complete by: As directed      Allergies as of 10/30/2019      Reactions   Erythromycin Swelling   Tetracyclines & Related Swelling      Medication List    STOP taking these medications   baclofen 10 MG tablet Commonly known as: LIORESAL     TAKE these medications   acyclovir 200 MG capsule Commonly known as: ZOVIRAX Take 200 mg by mouth daily.   alendronate 70 MG tablet Commonly  known as: FOSAMAX Take 70 mg by mouth once a week.   amitriptyline 25 MG tablet Commonly known as: ELAVIL Take 25 mg by mouth at bedtime.   brimonidine 0.2 % ophthalmic solution Commonly known as: ALPHAGAN Place 1 drop into both eyes in the morning and at bedtime.   fentaNYL 25 MCG/HR Commonly known as: DURAGESIC Place 1 patch onto the skin every 3 (three) days.   gabapentin 400 MG capsule Commonly known as: NEURONTIN Take 1 capsule (400 mg total) by mouth 2 (two) times daily. What changed:   how much to take  additional instructions   HYDROcodone-acetaminophen 10-325 MG tablet Commonly known as: NORCO Take 1 tablet by mouth 3 (three) times daily as needed for moderate pain or severe pain.   levothyroxine 88 MCG tablet Commonly known as: SYNTHROID Take 88 mcg by mouth daily.   lovastatin 10 MG tablet Commonly known as: MEVACOR Take 10 mg by mouth daily.   sodium chloride 1 g tablet Take 1 tablet (1 g total) by mouth once for 1 dose.       Consultations:  None  Procedures/Studies:  DG Chest Port 1 View  Result Date: 10/28/2019 CLINICAL DATA:  Altered mental status EXAM: PORTABLE CHEST 1 VIEW COMPARISON:  10/27/2019 FINDINGS: The heart size and mediastinal contours are within normal limits. Subtly increasing interstitial opacity within the right upper lobe. Background of mildly coarsened interstitial markings is overall unchanged. No pleural effusion or pneumothorax. The visualized skeletal structures are unremarkable. IMPRESSION: Subtly increasing interstitial opacity within the right upper lobe, suspicious for developing infection. Electronically Signed   By: Duanne Guess D.O.   On: 10/28/2019 14:55   DG Chest Portable 1 View  Result Date: 10/27/2019 CLINICAL DATA:  79 year old patient who is positive for COVID-19, presenting with shortness of breath, fever and confusion over the past 3 days. EXAM: PORTABLE CHEST 1 VIEW COMPARISON:  10/25/2019 and earlier.  FINDINGS: Cardiac silhouette normal in size, unchanged. Prominent interstitial markings diffusely are unchanged when compared to prior x-rays in 2018 and are therefore chronic. Lungs otherwise clear. No localized airspace consolidation. No pleural effusions. No pneumothorax. Normal pulmonary vascularity. IMPRESSION: No acute cardiopulmonary disease. Electronically Signed   By: Hulan Saas M.D.   On: 10/27/2019 14:57   DG Chest Portable 1 View  Result Date: 10/25/2019 CLINICAL DATA:  Fever and dizziness. EXAM: PORTABLE CHEST 1 VIEW COMPARISON:  August 05, 2017 FINDINGS: Mild diffuse chronic appearing increased lung markings are seen with a trace amount of bibasilar atelectasis. There is no evidence of acute infiltrate, pleural effusion or pneumothorax. The heart size and mediastinal contours are within normal limits. The visualized skeletal structures are unremarkable. IMPRESSION: No active disease. Electronically Signed   By: Aram Candela M.D.   On: 10/25/2019 19:49  The results of significant diagnostics from this hospitalization (including imaging, microbiology, ancillary and laboratory) are listed below for reference.     Microbiology: Recent Results (from the past 240 hour(s))  Blood culture (routine x 2)     Status: None (Preliminary result)   Collection Time: 10/25/19  6:45 PM   Specimen: BLOOD LEFT WRIST  Result Value Ref Range Status   Specimen Description   Final    BLOOD LEFT WRIST Performed at [facility], [address] [place], [place] [address]    Special Requests   Final    BOTTLES DRAWN AEROBIC AND ANAEROBIC Blood Culture adequate volume Performed at [facility], [address] [place], [place] [address]    Culture   Final    NO GROWTH 4 DAYS Performed at [facility], [address], [place], [place] [address]    Report Status PENDING  Incomplete  SARS Coronavirus 2 by RT PCR (hospital order, performed in [facility]  hospital lab) Nasopharyngeal Nasopharyngeal Swab     Status: Abnormal   Collection Time: 10/25/19  6:47 PM   Specimen: Nasopharyngeal Swab  Result Value Ref Range Status   SARS Coronavirus 2 POSITIVE (A) NEGATIVE Final    Comment: RESULT CALLED TO, READ BACK BY AND VERIFIED WITH: TALKINGTON,J. RN @2021  8/30/21BILLINGSLEY,L (NOTE) SARS-CoV-2 target nucleic acids are DETECTED  SARS-CoV-2 RNA is generally detectable in upper respiratory specimens  during the acute phase of infection.  Positive results are indicative  of the presence of the identified virus, but do not rule out bacterial infection or co-infection with other pathogens not detected by the test.  Clinical correlation with patient history and  other diagnostic information is necessary to determine patient infection status.  The expected result is negative.  Fact Sheet for Patients:   BoilerBrush.com.cy   Fact Sheet for Healthcare Providers:   https://pope.com/    This test is not yet approved or cleared by the [place] FDA and  has been authorized for detection and/or diagnosis of SARS-CoV-2 by FDA under an Emergency Use Authorization (EUA).  This EUA will remain in effect (meanin g this test can be used) for the duration of  the COVID-19 declaration under Section 564(b)(1) of the Act, 21 U.S.C. section 360-bbb-3(b)(1), unless the authorization is terminated or revoked sooner.  Performed at [facility], [address] [place], [place] [address]   Urine culture     Status: Abnormal   Collection Time: 10/25/19  7:44 PM   Specimen: Urine, Clean Catch  Result Value Ref Range Status   Specimen Description   Final    URINE, CLEAN CATCH Performed at [facility], [address] [place], [place] [address]    Special Requests   Final    NONE Performed at [facility], [address], [place], [place] [address]     Culture MULTIPLE SPECIES PRESENT, SUGGEST RECOLLECTION (A)  Final   Report Status 10/26/2019 FINAL  Final  Blood culture (routine x 2)     Status: None (Preliminary result)   Collection Time: 10/25/19  7:44 PM   Specimen: BLOOD RIGHT WRIST  Result Value Ref Range Status   Specimen Description   Final    BLOOD RIGHT WRIST Performed at [facility], [address] [place], [place] [address]    Special Requests   Final    BOTTLES DRAWN AEROBIC AND ANAEROBIC Blood Culture adequate volume Performed at [facility], [address] [place], [place] [address]    Culture   Final    NO GROWTH 4 DAYS Performed at [facility], [address], [place], [place] [address]    Report Status PENDING  Incomplete     Labs: BNP (last 3 results) Recent Labs    10/28/19 1500  BNP 90.3   Basic Metabolic Panel: Recent Labs  Lab 10/28/19 0006 10/28/19 0006 10/28/19 0339 10/29/19 0340 10/29/19 1525 10/30/19 0323 10/30/19 0748  NA 127*  --  127* 128* 132* 133*  --   K 3.6   < > 3.5 3.0* 4.3 6.0* 4.2  CL 94*  --  96* 99 102 104  --   CO2 21*  --  19* 21* 23 22  --   GLUCOSE 128*  --  122* 102* 133* 83  --   BUN 9  --  8 5* 7* 6*  --   CREATININE 0.69  --  0.65 0.61 0.67 0.65  --   CALCIUM 8.2*  --  8.2* 7.8* 8.1* 8.3*  --   MG  --   --   --  1.5* 2.3 2.2  --   PHOS  --   --   --  1.9* 2.8 2.3*  --    < > = values in this interval not displayed.   Liver Function Tests: Recent Labs  Lab 10/25/19 1847 10/27/19 2040 10/29/19 0340 10/29/19 1525 10/30/19 0323  AST 59* 53*  --   --   --   ALT 42 35  --   --   --   ALKPHOS 60 58  --   --   --   BILITOT 0.4 0.6  --   --   --   PROT 7.6 7.6  --   --   --   ALBUMIN 3.5 3.4* 2.8* 3.3* 3.0*   Recent Labs  Lab 10/27/19 2040  LIPASE 46   Recent Labs  Lab 10/25/19 1847 10/28/19 1500  AMMONIA 30 22   CBC: Recent Labs  Lab 10/25/19 1847 10/27/19 1430 10/30/19 0323  WBC 6.3 4.3 5.6  NEUTROABS  3.3 2.7  --   HGB 12.0 13.2 10.8*  HCT 37.1 39.1 32.7*  MCV 95.4 91.8 93.7  PLT 218 179 165   Cardiac Enzymes: No results for input(s): CKTOTAL, CKMB, CKMBINDEX, TROPONINI in the last 168 hours. BNP: Invalid input(s): POCBNP CBG: Recent Labs  Lab 10/25/19 1809  GLUCAP 98   D-Dimer Recent Labs    10/27/19 2040  DDIMER 0.69*   Hgb A1c No results for input(s): HGBA1C in the last 72 hours. Lipid Profile Recent Labs    10/27/19 2040  TRIG 81   Thyroid function studies Recent Labs    10/28/19 1500  TSH 4.298   Anemia work up Recent Labs    10/27/19 2040 10/28/19 1500  VITAMINB12  --  4,887*  FERRITIN 73  --    Urinalysis    Component Value Date/Time   COLORURINE YELLOW 10/27/2019 1603   APPEARANCEUR CLEAR 10/27/2019 1603   LABSPEC 1.010 10/27/2019 1603   PHURINE 8.0 10/27/2019 1603   GLUCOSEU NEGATIVE 10/27/2019 1603   HGBUR NEGATIVE 10/27/2019 1603   BILIRUBINUR NEGATIVE 10/27/2019 1603   KETONESUR NEGATIVE 10/27/2019 1603   PROTEINUR NEGATIVE 10/27/2019 1603   NITRITE NEGATIVE 10/27/2019 1603   LEUKOCYTESUR NEGATIVE 10/27/2019 1603   Sepsis Labs Invalid input(s): PROCALCITONIN,  WBC,  LACTICIDVEN   Time coordinating discharge: 35 minutes  SIGNED:  Almon Hercules, MD  [facility] 10/30/2019, 9:35 AM  If 7PM-7AM, please contact night-coverage [email or web address]

## 2019-10-30 NOTE — Progress Notes (Signed)
Pt discharged to home with son. Discharge instructions and mediation education provided to pt.

## 2019-11-27 ENCOUNTER — Emergency Department (HOSPITAL_BASED_OUTPATIENT_CLINIC_OR_DEPARTMENT_OTHER): Payer: Medicare (Managed Care)

## 2019-11-27 ENCOUNTER — Emergency Department (HOSPITAL_BASED_OUTPATIENT_CLINIC_OR_DEPARTMENT_OTHER)
Admission: EM | Admit: 2019-11-27 | Discharge: 2019-11-27 | Disposition: A | Discharge status: Home / Self Care | Payer: Medicare (Managed Care) | Attending: Emergency Medicine | Admitting: Emergency Medicine

## 2019-11-27 ENCOUNTER — Other Ambulatory Visit: Payer: Self-pay

## 2019-11-27 ENCOUNTER — Encounter (HOSPITAL_BASED_OUTPATIENT_CLINIC_OR_DEPARTMENT_OTHER): Payer: Self-pay | Admitting: Emergency Medicine

## 2019-11-27 DIAGNOSIS — M25562 Pain in left knee: Secondary | ICD-10-CM

## 2019-11-27 DIAGNOSIS — Z8616 Personal history of COVID-19: Secondary | ICD-10-CM | POA: Insufficient documentation

## 2019-11-27 DIAGNOSIS — Y9301 Activity, walking, marching and hiking: Secondary | ICD-10-CM | POA: Diagnosis not present

## 2019-11-27 DIAGNOSIS — Y9289 Other specified places as the place of occurrence of the external cause: Secondary | ICD-10-CM | POA: Insufficient documentation

## 2019-11-27 DIAGNOSIS — W010XXA Fall on same level from slipping, tripping and stumbling without subsequent striking against object, initial encounter: Secondary | ICD-10-CM | POA: Diagnosis not present

## 2019-11-27 DIAGNOSIS — M25462 Effusion, left knee: Secondary | ICD-10-CM

## 2019-11-27 NOTE — ED Notes (Signed)
Provider at the bedside.  

## 2019-11-27 NOTE — ED Provider Notes (Signed)
[facility] EMERGENCY DEPARTMENT Provider Note   CSN: [id number] Arrival date & time: 11/27/19  1657     History Chief Complaint  Patient presents with   Nina White is a 79 y.o. female with a history of osteoarthritis presented emergency department left knee pain.  The patient reports that she twisted her left knee while walking up the stairs yesterday, as her slipper slipped on wrong.  She did not fall or strike the knee directly.  She thinks that she twisted it to the side.  She complains of significant pain and swelling around the knee.  She is able to bear weight but only with a walker and very slowly.  Her son who lives with her says she is having difficulty getting around the apartment.  She is having difficulty navigating the stairs in particular.  She does have an orthopedist that she sees for other issues but has not seen them yet.  She and her husband were planning to take a trip for the winter to [place] but do not have a date set yet to leave  She denies any other injuries yesterday.  She denies blood thinner use.  She takes Norco chronically for pain.  She is also on a fentanyl patch.  She has not taken any NSAIDs recently.  HPI     Past Medical History:  Diagnosis Date   Arthritis    Back pain    H/O degenerative disc disease     Patient Active Problem List   Diagnosis Date Noted   Hyponatremia 10/27/2019   Lab test positive for detection of COVID-19 virus 10/27/2019   Intractable nausea and vomiting 10/27/2019   Elevated blood pressure reading 10/27/2019   HLD (hyperlipidemia) 10/27/2019    History reviewed. No pertinent surgical history.   OB History   No obstetric history on file.     No family history on file.  Social History   Tobacco Use   Smoking status: Never Smoker   Smokeless tobacco: Never Used  Substance Use Topics   Alcohol use: Never   Drug use: Not on file    Home Medications Prior to  Admission medications   Medication Sig Start Date End Date Taking? Authorizing Provider  acyclovir (ZOVIRAX) 200 MG capsule Take 200 mg by mouth daily.  10/22/19   [provider]  alendronate (FOSAMAX) 70 MG tablet Take 70 mg by mouth once a week. 08/01/19   [provider]  amitriptyline (ELAVIL) 25 MG tablet Take 25 mg by mouth at bedtime.  10/22/19   [provider]  brimonidine (ALPHAGAN) 0.2 % ophthalmic solution Place 1 drop into both eyes in the morning and at bedtime.  10/21/19   [provider]  fentaNYL (DURAGESIC) 25 MCG/HR Place 1 patch onto the skin every 3 (three) days.  10/13/19   [provider]  gabapentin (NEURONTIN) 400 MG capsule Take 1 capsule (400 mg total) by mouth 2 (two) times daily. 10/30/19   Almon Hercules, MD  HYDROcodone-acetaminophen (NORCO) 10-325 MG tablet Take 1 tablet by mouth 3 (three) times daily as needed for moderate pain or severe pain.  10/13/19   [provider]  levothyroxine (SYNTHROID) 88 MCG tablet Take 88 mcg by mouth daily. 08/23/19   [provider]  lovastatin (MEVACOR) 10 MG tablet Take 10 mg by mouth daily. 10/21/19   [provider]  senna-docusate (SENOKOT-S) 8.6-50 MG tablet Take 1 tablet by mouth 2 (two) times daily  between meals as needed for mild constipation. 10/30/19   Almon Hercules, MD    Allergies    Erythromycin and Tetracyclines & related  Review of Systems   Review of Systems  Constitutional: Negative for chills and fever.  Respiratory: Negative for cough.   Cardiovascular: Negative for chest pain and palpitations.  Gastrointestinal: Negative for abdominal pain and vomiting.  Musculoskeletal: Positive for arthralgias, joint swelling and myalgias.  Skin: Negative for rash and wound.  Allergic/Immunologic: Negative for food allergies and immunocompromised state.  Neurological: Negative for weakness and light-headedness.  Psychiatric/Behavioral: Negative for agitation  and confusion.  All other systems reviewed and are negative.   Physical Exam Updated Vital Signs BP (!) 149/95 (BP Location: Right Arm)    Pulse 88    Temp 98.5 F (36.9 C) (Oral)    Resp 18    Ht 5' (1.524 m)    Wt 58.1 kg    SpO2 99%    BMI 25.00 kg/m   Physical Exam Vitals and nursing note reviewed.  Constitutional:      General: She is not in acute distress.    Appearance: She is well-developed.  HENT:     Head: Normocephalic and atraumatic.  Eyes:     Conjunctiva/sclera: Conjunctivae normal.  Cardiovascular:     Rate and Rhythm: Normal rate and regular rhythm.     Pulses: Normal pulses.  Pulmonary:     Effort: Pulmonary effort is normal. No respiratory distress.     Breath sounds: Normal breath sounds.  Abdominal:     Palpations: Abdomen is soft.  Musculoskeletal:     Cervical back: Neck supple.     Comments: Mild effusion proximal and lateral to left patella No focal ttp of the left patella or fibula head No erythema or warmth of the joint Limited active ROM in left knee 2/2 pain Distal extremity is neurovascularly intact  Skin:    General: Skin is warm and dry.  Neurological:     General: No focal deficit present.     Mental Status: She is alert and oriented to person, place, and time.     Sensory: No sensory deficit.     Motor: No weakness.  Psychiatric:        Mood and Affect: Mood normal.        Behavior: Behavior normal.     ED Results / Procedures / Treatments   Labs (all labs ordered are listed, but only abnormal results are displayed) Labs Reviewed - No data to display  EKG None  Radiology DG Knee Complete 4 Views Left  Result Date: 11/27/2019 CLINICAL DATA:  Fall EXAM: LEFT KNEE - COMPLETE 4+ VIEW COMPARISON:  None. FINDINGS: No fracture or dislocation is seen. The joint spaces are preserved. The visualized soft tissues are unremarkable. No definite suprapatellar knee joint effusion. IMPRESSION: Negative. Electronically Signed   By: Charline Bills M.D.   On: 11/27/2019 18:41    Procedures Procedures (including critical care time)  Medications Ordered in ED Medications - No data to display  ED Course  I have reviewed the triage vital signs and the nursing notes.  Pertinent labs & imaging results that were available during my care of the patient were reviewed by me and considered in my medical decision making (see chart for details).  This is a 80 old female present emergency department with mechanical injury to her left knee, twisting her leg while climbing the stairs yesterday.  There is no falls or direct  trauma on the knee.  She does have an effusion and tenderness just proximal and superior to the patella.  Suspect this is likely an injury to the ligaments or tendons.  However she is having difficulty bearing weight and limited range of motion.  I think an x-ray to rule out fracture would be prudent.  If this is negative, she can follow-up with orthopedic doctor in 1 week.  She already has a walker at home and lives with her son.  She can also take NSAIDs for a couple of days.  She has no direct contraindications to taking this medication.  Clinical Course as of Nov 28 38  Sat Nov 27, 2019  1859 Reviewed the work-up with the patient and her son.  I would suggest a compression sleeve for knee stability at home.  Advised to follow-up with orthopedist in 1 week.  She sees Dr Ethelene Hal of [facility].     [MT]    Clinical Course User Index [MT] Nelda Luckey, Kermit Balo, MD    Final Clinical Impression(s) / ED Diagnoses Final diagnoses:  Acute pain of left knee  Knee effusion, left    Rx / DC Orders ED Discharge Orders    None       Alyssa Mancera, Kermit Balo, MD 11/28/19 0040

## 2019-11-27 NOTE — Discharge Instructions (Signed)
Please follow up with your orthopedic doctor in 1-2 weeks.  I advised buying an elastic compression sleeve for your knee at home.  Wear this during the daytime (you can take it off at night) for extra stability when walking.  Always use a walker, and have someone assist you whenever walking or climbing stairs.

## 2019-11-27 NOTE — ED Notes (Signed)
Taken to xray at this time. 

## 2019-11-27 NOTE — ED Triage Notes (Signed)
Brought by ems from home.  Reports falling up the steps last night twisting left knee.  C/o pain and swelling.

## 2019-12-28 ENCOUNTER — Telehealth: Payer: Self-pay | Admitting: Physical Medicine and Rehabilitation

## 2019-12-28 NOTE — Telephone Encounter (Signed)
Pt called stating she is having pain in her R leg pt states she believes it's an SI joint and she would like to get an injection   [phone]

## 2019-12-29 ENCOUNTER — Telehealth: Payer: Self-pay

## 2019-12-29 NOTE — Telephone Encounter (Signed)
Left message #1 to schedule OV. 

## 2019-12-29 NOTE — Telephone Encounter (Signed)
See previous message

## 2019-12-29 NOTE — Telephone Encounter (Signed)
Patient called she is requesting a appt. she stated she is out of town and she will be relocating to [place] in December. Call back:[phone]

## 2019-12-29 NOTE — Telephone Encounter (Signed)
Scheduled for OV. 

## 2020-02-08 ENCOUNTER — Ambulatory Visit (INDEPENDENT_AMBULATORY_CARE_PROVIDER_SITE_OTHER): Payer: Medicare (Managed Care) | Admitting: Physical Medicine and Rehabilitation

## 2020-02-08 ENCOUNTER — Encounter: Payer: Self-pay | Admitting: Physical Medicine and Rehabilitation

## 2020-02-08 ENCOUNTER — Other Ambulatory Visit: Payer: Self-pay

## 2020-02-08 VITALS — BP 125/70 | HR 60

## 2020-02-08 DIAGNOSIS — G8929 Other chronic pain: Secondary | ICD-10-CM | POA: Diagnosis not present

## 2020-02-08 DIAGNOSIS — M25551 Pain in right hip: Secondary | ICD-10-CM | POA: Diagnosis not present

## 2020-02-08 DIAGNOSIS — M461 Sacroiliitis, not elsewhere classified: Secondary | ICD-10-CM

## 2020-02-08 DIAGNOSIS — M5441 Lumbago with sciatica, right side: Secondary | ICD-10-CM | POA: Diagnosis not present

## 2020-02-08 NOTE — Progress Notes (Signed)
"  Shooting pain" down right leg if she bends at the waist. Patient states that she has always been able to bend and put palms on the floor, but this causes her pain now. Patient states that you did an injection in the past for the same problem and it helped for several years. Right SI joint RFA in 2017. Numeric Pain Rating Scale and Functional Assessment Average Pain 10 Pain Right Now 0 My pain is intermittent, sharp and burning Pain is worse with: bending Pain improves with: rest   In the last MONTH (on 0-10 scale) has pain interfered with the following?  1. General activity like being  able to carry out your everyday physical activities such as walking, climbing stairs, carrying groceries, or moving a chair?  Rating(1)  2. Relation with others like being able to carry out your usual social activities and roles such as  activities at home, at work and in your community. Rating(1)  3. Enjoyment of life such that you have  been bothered by emotional problems such as feeling anxious, depressed or irritable?  Rating(0)

## 2020-02-09 ENCOUNTER — Encounter: Payer: Self-pay | Admitting: Physical Medicine and Rehabilitation

## 2020-02-09 NOTE — Progress Notes (Signed)
Nina White - 79 y.o. female MRN [id number]  Date of birth: [birth year]  Office Visit Note: Visit Date: 02/08/2020 PCP: Leola Brazil, DO Referred by: Leola Brazil, DO  Subjective: Chief Complaint  Patient presents with  . Right Leg - Pain   HPI: Nina White is a 79 y.o. female who comes in today Evaluation management of chronic worsening severe right hip and leg pain.  Her brief history is that she is followed by Dr. Sheran Luz at [facility] and she is a former nurse I believe.  I saw the patient on one occasion in 2017 and completed radiofrequency ablation of the right sacroiliac joint which is ablation of the L5 dorsal rami and S1 and S2 lateral branches.  This was completed at the request of Dr. Ethelene Hal at the time after he had completed diagnostic sacroiliac joint injections.  Since that time she has been followed by him.  He maintains her pain medicine with 10 Duragesic 25 mcg patches per month along with hydrocodone 10/325 90 tablets/month.  She also takes Elavil as well as gabapentin.  It appears that she sees them monthly and she does report today that she has a appointment with Dr. Ethelene Hal in a couple of days.  She has not had repeat radiofrequency ablation of the SI joint.  She came in today thinking that this is the same pain that she was having in 2017.  She reports that shooting pain down the right leg with posterior buttock or pain over the PSIS.  She reports good flexibility and continued home exercise and can usually put her palms to the floor with good flexibility but now when she tries to bend forward she gets the shooting pain in the right leg.  No symptoms on the left.  No paresthesias.  No focal weakness.  No specific injury.  She reports 10 out of 10 pain on average despite medication treatment.  This does really limit her activities now because of the nature of the pain.  She really did not have a great understanding of what we completed in the past which was  the ablation.  She did not follow-up with Dr. Ethelene Hal to see if she needed another ablation.  I did spend quite a bit of time talking about radiofrequency ablation about how long they usually last in the pitfalls of sacroiliac joint denervation versus facet joint etc.  She reports that this is the same pain that she had at the time and it seemed to help her at that time.  Her case is complicated by chronic pain syndrome as well as rheumatoid arthritis.  Review of Systems  Musculoskeletal: Positive for back pain.  All other systems reviewed and are negative.  Otherwise per HPI.  Assessment & Plan: Visit Diagnoses:    ICD-10-CM   1. Sacroiliitis (HCC)  M46.1   2. Chronic right-sided low back pain with right-sided sciatica  M54.41    G89.29   3. Pain in right hip  M25.551      Plan: Findings:  Chronic pain syndrome and chronic history of opioid management through Dr. Sheran Luz with history of injections in the past with radiofrequency ablation of the right sacroiliac joint performed by myself in 2017.  Now with worsening right radicular type pain in the leg with forward flexion.  Differential diagnosis would be potential for sacroiliac joint dysfunction causing a referral pain in the leg.  There is no paresthesia.  It does not go all the way  to the foot.  Other diagnoses obviously would be more of a radicular L5 pattern.  She does get pain with forward flexion.  She does have a positive Fortin finger test.  Equivocal Patrick's.  At this point after long discussion with her she does have follow-up with Dr. Ethelene Hal in a couple of days.  I will email him to let them know that she was here talking about repeating the ablation.  I would like to get his opinion on what should be performed next.  Obviously if he felt like this was more of an epidural type issue which I think it might be that I think he could perform that diagnostically but that is up to him.  I did discuss the fact that getting approval for  the ablation may be difficult given the timeframe between the last 1 and now.  She has not had any trauma or any other reason to have this pain.  She has not had updated MRI of the lumbar spine.  She will continue with pain medicine management and adjunctive pain management treatment there Dr. Ethelene Hal.    Meds & Orders: No orders of the defined types were placed in this encounter.  No orders of the defined types were placed in this encounter.   Follow-up: Return if symptoms worsen or fail to improve.   Procedures: No procedures performed      Clinical History: No specialty comments available.   She reports that she has never smoked. She has never used smokeless tobacco. No results for input(s): HGBA1C, LABURIC in the last 8760 hours.  Objective:  VS:  HT:    WT:   BMI:     BP:125/70  HR:60bpm  TEMP: ( )  RESP:  Physical Exam Constitutional:      General: She is not in acute distress.    Appearance: Normal appearance. She is not ill-appearing.  HENT:     Head: Normocephalic and atraumatic.     Right Ear: External ear normal.     Left Ear: External ear normal.  Eyes:     Extraocular Movements: Extraocular movements intact.  Cardiovascular:     Rate and Rhythm: Normal rate.     Pulses: Normal pulses.  Musculoskeletal:     Right lower leg: No edema.     Left lower leg: No edema.     Comments: Patient has good distal strength with no pain over the greater trochanters.  No clonus or focal weakness.  She does have a positive Fortin finger sign on the right.  She has equivocal Patrick's testing.  Positive slump.  Skin:    Findings: No erythema, lesion or rash.  Neurological:     General: No focal deficit present.     Mental Status: She is alert and oriented to person, place, and time.     Sensory: No sensory deficit.     Motor: No weakness or abnormal muscle tone.     Coordination: Coordination normal.  Psychiatric:        Mood and Affect: Mood normal.        Behavior:  Behavior normal.     Ortho Exam  Imaging: No results found.  Past Medical/Family/Surgical/Social History: Medications & Allergies reviewed per EMR, new medications updated. Patient Active Problem List   Diagnosis Date Noted  . Hyponatremia 10/27/2019  . Lab test positive for detection of COVID-19 virus 10/27/2019  . Intractable nausea and vomiting 10/27/2019  . Elevated blood pressure reading 10/27/2019  . HLD (hyperlipidemia)  10/27/2019   Past Medical History:  Diagnosis Date  . Arthritis   . Back pain   . H/O degenerative disc disease    History reviewed. No pertinent family history. History reviewed. No pertinent surgical history. Social History   Occupational History  . Not on file  Tobacco Use  . Smoking status: Never Smoker  . Smokeless tobacco: Never Used  Substance and Sexual Activity  . Alcohol use: Never  . Drug use: Not on file  . Sexual activity: Not on file

## 2020-09-22 ENCOUNTER — Other Ambulatory Visit: Payer: Self-pay

## 2020-09-22 ENCOUNTER — Emergency Department (HOSPITAL_BASED_OUTPATIENT_CLINIC_OR_DEPARTMENT_OTHER): Payer: Medicare Other

## 2020-09-22 ENCOUNTER — Observation Stay (HOSPITAL_COMMUNITY): Payer: Medicare Other

## 2020-09-22 ENCOUNTER — Observation Stay (HOSPITAL_BASED_OUTPATIENT_CLINIC_OR_DEPARTMENT_OTHER)
Admission: EM | Admit: 2020-09-22 | Discharge: 2020-09-23 | Disposition: A | Discharge status: Home / Self Care | Payer: Medicare Other | Attending: Internal Medicine | Admitting: Internal Medicine

## 2020-09-22 DIAGNOSIS — Z20822 Contact with and (suspected) exposure to covid-19: Secondary | ICD-10-CM | POA: Diagnosis not present

## 2020-09-22 DIAGNOSIS — R531 Weakness: Secondary | ICD-10-CM

## 2020-09-22 DIAGNOSIS — Z8616 Personal history of COVID-19: Secondary | ICD-10-CM | POA: Insufficient documentation

## 2020-09-22 DIAGNOSIS — G9341 Metabolic encephalopathy: Secondary | ICD-10-CM | POA: Diagnosis not present

## 2020-09-22 DIAGNOSIS — Y900 Blood alcohol level of less than 20 mg/100 ml: Secondary | ICD-10-CM | POA: Diagnosis not present

## 2020-09-22 DIAGNOSIS — Z79899 Other long term (current) drug therapy: Secondary | ICD-10-CM | POA: Diagnosis not present

## 2020-09-22 DIAGNOSIS — R4182 Altered mental status, unspecified: Secondary | ICD-10-CM

## 2020-09-22 DIAGNOSIS — G9340 Encephalopathy, unspecified: Secondary | ICD-10-CM | POA: Diagnosis present

## 2020-09-22 DIAGNOSIS — R946 Abnormal results of thyroid function studies: Secondary | ICD-10-CM | POA: Insufficient documentation

## 2020-09-22 LAB — CBC WITH DIFFERENTIAL/PLATELET
Abs Immature Granulocytes: 0.01 10*3/uL (ref 0.00–0.07)
Basophils Absolute: 0.1 10*3/uL (ref 0.0–0.1)
Basophils Relative: 1 %
Eosinophils Absolute: 0.3 10*3/uL (ref 0.0–0.5)
Eosinophils Relative: 5 %
HCT: 38.4 % (ref 36.0–46.0)
Hemoglobin: 12.8 g/dL (ref 12.0–15.0)
Immature Granulocytes: 0 %
Lymphocytes Relative: 55 %
Lymphs Abs: 3.1 10*3/uL (ref 0.7–4.0)
MCH: 32.2 pg (ref 26.0–34.0)
MCHC: 33.3 g/dL (ref 30.0–36.0)
MCV: 96.5 fL (ref 80.0–100.0)
Monocytes Absolute: 0.5 10*3/uL (ref 0.1–1.0)
Monocytes Relative: 8 %
Neutro Abs: 1.8 10*3/uL (ref 1.7–7.7)
Neutrophils Relative %: 31 %
Platelets: 255 10*3/uL (ref 150–400)
RBC: 3.98 MIL/uL (ref 3.87–5.11)
RDW: 13.2 % (ref 11.5–15.5)
WBC: 5.7 10*3/uL (ref 4.0–10.5)
nRBC: 0 % (ref 0.0–0.2)

## 2020-09-22 LAB — VITAMIN B12: Vitamin B-12: 2266 pg/mL — ABNORMAL HIGH (ref 180–914)

## 2020-09-22 LAB — RESP PANEL BY RT-PCR (FLU A&B, COVID) ARPGX2
Influenza A by PCR: NEGATIVE
Influenza B by PCR: NEGATIVE
SARS Coronavirus 2 by RT PCR: NEGATIVE

## 2020-09-22 LAB — COMPREHENSIVE METABOLIC PANEL
ALT: 18 U/L (ref 0–44)
AST: 26 U/L (ref 15–41)
Albumin: 3.7 g/dL (ref 3.5–5.0)
Alkaline Phosphatase: 54 U/L (ref 38–126)
Anion gap: 6 (ref 5–15)
BUN: 13 mg/dL (ref 8–23)
CO2: 28 mmol/L (ref 22–32)
Calcium: 8.9 mg/dL (ref 8.9–10.3)
Chloride: 102 mmol/L (ref 98–111)
Creatinine, Ser: 0.85 mg/dL (ref 0.44–1.00)
GFR, Estimated: >60 mL/min (ref >60)
Glucose, Bld: 95 mg/dL (ref 70–99)
Potassium: 3.8 mmol/L (ref 3.5–5.1)
Sodium: 136 mmol/L (ref 135–145)
Total Bilirubin: 0.4 mg/dL (ref 0.3–1.2)
Total Protein: 7.3 g/dL (ref 6.5–8.1)

## 2020-09-22 LAB — URINALYSIS, ROUTINE W REFLEX MICROSCOPIC
Bilirubin Urine: NEGATIVE
Glucose, UA: NEGATIVE mg/dL
Hgb urine dipstick: NEGATIVE
Ketones, ur: NEGATIVE mg/dL
Nitrite: NEGATIVE
Protein, ur: NEGATIVE mg/dL
Specific Gravity, Urine: 1.01 (ref 1.005–1.030)
pH: 6 (ref 5.0–8.0)

## 2020-09-22 LAB — LIPID PANEL
Cholesterol: 165 mg/dL (ref 0–200)
HDL: 56 mg/dL (ref >40)
LDL Cholesterol: 88 mg/dL (ref 0–99)
Total CHOL/HDL Ratio: 2.9 RATIO
Triglycerides: 106 mg/dL (ref <150)
VLDL: 21 mg/dL (ref 0–40)

## 2020-09-22 LAB — HEMOGLOBIN A1C
Hgb A1c MFr Bld: 6 % — ABNORMAL HIGH (ref 4.8–5.6)
Mean Plasma Glucose: 125.5 mg/dL

## 2020-09-22 LAB — ETHANOL: Alcohol, Ethyl (B): <10 mg/dL (ref <10)

## 2020-09-22 LAB — URINALYSIS, MICROSCOPIC (REFLEX)

## 2020-09-22 LAB — RAPID URINE DRUG SCREEN, HOSP PERFORMED
Amphetamines: NOT DETECTED
Barbiturates: NOT DETECTED
Benzodiazepines: NOT DETECTED
Cocaine: NOT DETECTED
Opiates: NOT DETECTED
Tetrahydrocannabinol: NOT DETECTED

## 2020-09-22 LAB — CBG MONITORING, ED: Glucose-Capillary: 85 mg/dL (ref 70–99)

## 2020-09-22 LAB — TSH: TSH: 8 u[IU]/mL — ABNORMAL HIGH (ref 0.350–4.500)

## 2020-09-22 MED ORDER — ACYCLOVIR 200 MG PO CAPS: 200.0000 mg | ORAL_CAPSULE | Freq: Every day | ORAL | Status: DC

## 2020-09-22 MED ORDER — PRAVASTATIN SODIUM 10 MG PO TABS
10.0000 mg | ORAL_TABLET | Freq: Every day | ORAL | Status: DC
  Filled 2020-09-22: qty 1

## 2020-09-22 MED ORDER — AMITRIPTYLINE HCL 25 MG PO TABS
25.0000 mg | ORAL_TABLET | Freq: Every day | ORAL | Status: DC
  Administered 2020-09-22: 25 mg via ORAL
  Filled 2020-09-22: qty 1

## 2020-09-22 MED ORDER — HYDROCODONE-ACETAMINOPHEN 10-325 MG PO TABS
1.0000 | ORAL_TABLET | Freq: Three times a day (TID) | ORAL | Status: DC | PRN
  Filled 2020-09-22: qty 1

## 2020-09-22 MED ORDER — ACETAMINOPHEN 650 MG RE SUPP: 650.0000 mg | Freq: Four times a day (QID) | RECTAL | Status: DC | PRN

## 2020-09-22 MED ORDER — ACETAMINOPHEN 325 MG PO TABS: 650.0000 mg | ORAL_TABLET | Freq: Four times a day (QID) | ORAL | Status: DC | PRN

## 2020-09-22 MED ORDER — LEVOTHYROXINE SODIUM 88 MCG PO TABS
88.0000 ug | ORAL_TABLET | Freq: Every day | ORAL | Status: DC
  Administered 2020-09-23: 88 ug via ORAL
  Filled 2020-09-22: qty 1

## 2020-09-22 MED ORDER — ENOXAPARIN SODIUM 40 MG/0.4ML IJ SOSY
40.0000 mg | PREFILLED_SYRINGE | INTRAMUSCULAR | Status: DC
  Administered 2020-09-22: 40 mg via SUBCUTANEOUS
  Filled 2020-09-22: qty 0.4

## 2020-09-22 MED ORDER — BRIMONIDINE TARTRATE 0.2 % OP SOLN
1.0000 [drp] | Freq: Three times a day (TID) | OPHTHALMIC | Status: DC
  Filled 2020-09-22: qty 5

## 2020-09-22 MED ORDER — SENNOSIDES-DOCUSATE SODIUM 8.6-50 MG PO TABS: 1.0000 | ORAL_TABLET | Freq: Two times a day (BID) | ORAL | Status: DC | PRN

## 2020-09-22 MED ORDER — GABAPENTIN 400 MG PO CAPS
400.0000 mg | ORAL_CAPSULE | Freq: Two times a day (BID) | ORAL | Status: DC
  Administered 2020-09-22 – 2020-09-23 (×2): 400 mg via ORAL
  Filled 2020-09-22 (×2): qty 1

## 2020-09-22 NOTE — ED Notes (Signed)
 FENTANYL PATCH ON ABD, REMOVED BY ED MD

## 2020-09-22 NOTE — ED Notes (Signed)
Report called to [place] RN at [facility] [address]

## 2020-09-22 NOTE — ED Notes (Signed)
BEFAST: GAIT IS POOR, SPEECH POOR - BEFAST POSITIVE NPO IMMEDIATELY IMPLEMENTED, SAFETY MEASURES IN PLACE, PT IS A HIGH FALL RISK

## 2020-09-22 NOTE — ED Notes (Signed)
Head CT complete

## 2020-09-22 NOTE — ED Notes (Signed)
ED Provider at bedside. 

## 2020-09-22 NOTE — ED Provider Notes (Signed)
[facility] EMERGENCY DEPARTMENT Provider Note   CSN: [id number] Arrival date & time: 09/22/20  1319     History Chief Complaint  Patient presents with   Altered Mental Status    Nina White is a 80 y.o. female.  The history is provided by the patient and a caregiver.  Altered Mental Status Presenting symptoms: confusion and disorientation   Severity:  Mild Most recent episode:  Today Episode history:  Continuous Timing:  Constant Progression:  Unchanged Chronicity:  New Context comment:  Patient overnight started to have some confusion, worse this morning and having hard time walking. Family concerned for stroke or medication issue, on pain pills and fentanyl patch. Associated symptoms: no abdominal pain, no fever, no palpitations, no rash, no seizures and no vomiting       Past Medical History:  Diagnosis Date   Arthritis    Back pain    H/O degenerative disc disease     Patient Active Problem List   Diagnosis Date Noted   Hyponatremia 10/27/2019   Lab test positive for detection of COVID-19 virus 10/27/2019   Intractable nausea and vomiting 10/27/2019   Elevated blood pressure reading 10/27/2019   HLD (hyperlipidemia) 10/27/2019    No past surgical history on file.   OB History   No obstetric history on file.     No family history on file.  Social History   Tobacco Use   Smoking status: Never   Smokeless tobacco: Never  Substance Use Topics   Alcohol use: Never    Home Medications Prior to Admission medications   Medication Sig Start Date End Date Taking? Authorizing Provider  acyclovir (ZOVIRAX) 200 MG capsule Take 200 mg by mouth daily.  10/22/19   [provider]  alendronate (FOSAMAX) 70 MG tablet Take 70 mg by mouth once a week. 08/01/19   [provider]  amitriptyline (ELAVIL) 25 MG tablet Take 25 mg by mouth at bedtime.  10/22/19   [provider]  brimonidine (ALPHAGAN) 0.2 % ophthalmic solution  Place 1 drop into both eyes in the morning and at bedtime.  10/21/19   [provider]  fentaNYL (DURAGESIC) 25 MCG/HR Place 1 patch onto the skin every 3 (three) days.  10/13/19   [provider]  gabapentin (NEURONTIN) 400 MG capsule Take 1 capsule (400 mg total) by mouth 2 (two) times daily. 10/30/19   Almon Hercules, MD  HYDROcodone-acetaminophen (NORCO) 10-325 MG tablet Take 1 tablet by mouth 3 (three) times daily as needed for moderate pain or severe pain.  10/13/19   [provider]  levothyroxine (SYNTHROID) 88 MCG tablet Take 88 mcg by mouth daily. 08/23/19   [provider]  lovastatin (MEVACOR) 10 MG tablet Take 10 mg by mouth daily. 10/21/19   [provider]  senna-docusate (SENOKOT-S) 8.6-50 MG tablet Take 1 tablet by mouth 2 (two) times daily between meals as needed for mild constipation. 10/30/19   Almon Hercules, MD    Allergies    Erythromycin and Tetracyclines & related  Review of Systems   Review of Systems  Constitutional:  Negative for chills and fever.  HENT:  Negative for ear pain and sore throat.   Eyes:  Negative for pain and visual disturbance.  Respiratory:  Negative for cough and shortness of breath.   Cardiovascular:  Negative for chest pain and palpitations.  Gastrointestinal:  Negative for abdominal pain and vomiting.  Genitourinary:  Negative for dysuria and hematuria.  Musculoskeletal:  Positive for gait problem. Negative for arthralgias and back pain.  Skin:  Negative for color change and rash.  Neurological:  Negative for seizures and syncope.  Psychiatric/Behavioral:  Positive for confusion.   All other systems reviewed and are negative.  Physical Exam Updated Vital Signs BP 131/80 (BP Location: Left Arm)   Pulse (!) 53   Temp 99.6 F (37.6 C)   Resp 14   Ht 5\' 2"  (1.575 m)   Wt 54.4 kg   SpO2 96%   BMI 21.95 kg/m   Physical Exam Vitals and nursing note reviewed.  Constitutional:      General: She is not  in acute distress.    Appearance: She is well-developed. She is not ill-appearing.  HENT:     Head: Normocephalic and atraumatic.     Right Ear: Tympanic membrane normal.     Nose: Nose normal.     Mouth/Throat:     Mouth: Mucous membranes are moist.  Eyes:     Extraocular Movements: Extraocular movements intact.     Conjunctiva/sclera: Conjunctivae normal.     Pupils: Pupils are equal, round, and reactive to light.     Comments: Pinpoint pupils bilaterally   Cardiovascular:     Rate and Rhythm: Normal rate and regular rhythm.     Pulses: Normal pulses.     Heart sounds: Normal heart sounds. No murmur heard. Pulmonary:     Effort: Pulmonary effort is normal. No respiratory distress.     Breath sounds: Normal breath sounds.  Abdominal:     Palpations: Abdomen is soft.     Tenderness: There is no abdominal tenderness.  Musculoskeletal:     Cervical back: Normal range of motion and neck supple.  Skin:    General: Skin is warm and dry.     Capillary Refill: Capillary refill takes less than 2 seconds.  Neurological:     General: No focal deficit present.     Mental Status: She is alert and oriented to person, place, and time.     Cranial Nerves: No cranial nerve deficit.     Sensory: No sensory deficit.     Motor: No weakness.     Coordination: Coordination normal.     Comments: 5/5 strength and normal sensation, normal visual fields, normal speech but slightly slurred, normal finger to nose finger     ED Results / Procedures / Treatments   Labs (all labs ordered are listed, but only abnormal results are displayed) Labs Reviewed  RESP PANEL BY RT-PCR (FLU A&B, COVID) [id number]  CBC WITH DIFFERENTIAL/PLATELET  COMPREHENSIVE METABOLIC PANEL  ETHANOL  URINALYSIS, ROUTINE W REFLEX MICROSCOPIC  RAPID URINE DRUG SCREEN, HOSP PERFORMED  CBG MONITORING, ED    EKG EKG Interpretation  Date/Time:  Friday September 22 2020 13:32:32 EDT Ventricular Rate:  57 PR Interval:  168 QRS  Duration: 87 QT Interval:  431 QTC Calculation: 420 R Axis:   20 Text Interpretation: Sinus rhythm Low voltage, precordial leads Confirmed by [id number]) on 09/22/2020 1:34:26 PM  Radiology DG Chest 2 View  Result Date: 09/22/2020 CLINICAL DATA:  Altered mental status. Slurred speech and difficulty walking. EXAM: CHEST - 2 VIEW COMPARISON:  10/28/2019 FINDINGS: The heart size and mediastinal contours are within normal limits. Both lungs are clear. The visualized skeletal structures are unremarkable. IMPRESSION: No active cardiopulmonary disease. Electronically Signed   By: 12/28/2019 M.D.   On: 09/22/2020 14:44   CT Head Wo Contrast  Result Date: 09/22/2020 CLINICAL  DATA:  Altered mental status, slurred speech, difficulty walking, fentanyl patch EXAM: CT HEAD WITHOUT CONTRAST TECHNIQUE: Contiguous axial images were obtained from the base of the skull through the vertex without intravenous contrast. COMPARISON:  None. FINDINGS: Brain: No evidence of acute infarction, hemorrhage, hydrocephalus, extra-axial collection or mass lesion/mass effect. Periventricular and deep white matter hypodensity. Vascular: No hyperdense vessel or unexpected calcification. Skull: Normal. Negative for fracture or focal lesion. Sinuses/Orbits: No acute finding. Other: None. IMPRESSION: No acute intracranial pathology. Small-vessel white matter disease. Electronically Signed   By: Lauralyn Primes M.D.   On: 09/22/2020 14:43    Procedures Procedures   Medications Ordered in ED Medications - No data to display  ED Course  I have reviewed the triage vital signs and the nursing notes.  Pertinent labs & imaging results that were available during my care of the patient were reviewed by me and considered in my medical decision making (see chart for details).    MDM Rules/Calculators/A&P                           Nina White is a 80 year old female with history of chronic back pain on opioids who  presents the ED with altered mental status.  Normal vitals.  No fever.  Patient with some confusion that may be started overnight about 6 to 7 hours ago.  Started to have some slurred speech and difficulty walking.  Son of the patient states that she saw her about 2 hours ago and did not appear to be walking well.  Concerned about polypharmacy as she is on opioids and does have a fentanyl pain patch on.  Concerned possibly for accidental overdose.  Concern for stroke.  Patient overall has normal neurological exam but she does have some slurred speech and does have pinpoint pupils bilaterally.  She does appear to be mildly confused.  She does not appear to be steady enough to ambulate.  She is outside the window for acute stroke intervention including tPA or thrombectomy.  No concern for large vessel occlusion on exam.  Overall suspect polypharmacy but could be a stroke.  Will check basic labs including a head CT.  Anticipate admission for MRI and metabolization of medications and medication review.  Overall lab work is unremarkable.  Head CT without any evidence of head bleed.  Chest x-ray without evidence of infection.  Awaiting urinalysis but overall suspect patient needs admission for further stroke rule out, metabolization of meds.  Fentanyl patch was already removed.  To be admitted to medicine team.  This chart was dictated using voice recognition software.  Despite best efforts to proofread,  errors can occur which can change the documentation meaning.   This chart was dictated using voice recognition software.  Despite best efforts to proofread,  errors can occur which can change the documentation meaning.   Final Clinical Impression(s) / ED Diagnoses Final diagnoses:  Altered mental status, unspecified altered mental status type    Rx / DC Orders ED Discharge Orders     None        Virgina Norfolk, DO 09/22/20 1510

## 2020-09-22 NOTE — ED Notes (Signed)
Brought in by son, states he came home at 1200hrs and his mother had slurred speech and had difficulty walking. Immediately placed in exam room, placed on cont cardiac monitoring and ECG and CBG done, BEFAST/ VAN assesment along with NIH performed. Son states mother has "poly pharmacy of opioids"

## 2020-09-22 NOTE — H&P (Signed)
History and Physical    ITA HOLTAN FHL:[id number] DOB: 1940-03-12 DOA: 09/22/2020  PCP: Leola Brazil, DO (Confirm with patient/family/NH records and if not entered, this has to be entered at [facility] point of entry) Patient coming from: Home  I have personally briefly reviewed patient's old medical records in [facility]  Chief Complaint: Feeling fine  HPI: Nina White is a 80 y.o. female with medical history significant of chronic back pain on narcotics,, presented with after mentations.  Patient does not recall what happened.  History provided by daughter at bedside.  Patient was last seen normal last night.  This morning, patient called son asking "come to 2016" which does not make any sense to the patient family.  Family found patient in her room, with her hydrocodone pillbox open, and pills scattered.  Patient had slurred speech, eyes staring.  At the time I saw the patient, her mentation has recovered to her baseline.  She has no confusions, but she does not recall what happened this morning.  Denies any numbness weakness of any of the limbs, no slurred speech.  ED Course: Patient was found to have unsteady gait and confusion in the ED, fentanyl patch removed.  Blood work including CBC and BMP largely normal.  UA showed no signs of UTI.  Patient mentation much improved and back to baseline on arrival on floor.  Review of Systems: As per HPI otherwise 14 point review of systems negative.    Past Medical History:  Diagnosis Date   Arthritis    Back pain    H/O degenerative disc disease     No past surgical history on file.   reports that she has never smoked. She has never used smokeless tobacco. She reports that she does not drink alcohol. No history on file for drug use.  Allergies  Allergen Reactions   Erythromycin Swelling   Tetracyclines & Related Swelling    No family history on file.   Prior to Admission medications   Medication Sig Start Date  End Date Taking? Authorizing Provider  acyclovir (ZOVIRAX) 200 MG capsule Take 200 mg by mouth daily.  10/22/19   [provider]  alendronate (FOSAMAX) 70 MG tablet Take 70 mg by mouth once a week. 08/01/19   [provider]  amitriptyline (ELAVIL) 25 MG tablet Take 25 mg by mouth at bedtime.  10/22/19   [provider]  brimonidine (ALPHAGAN) 0.2 % ophthalmic solution Place 1 drop into both eyes in the morning and at bedtime.  10/21/19   [provider]  fentaNYL (DURAGESIC) 25 MCG/HR Place 1 patch onto the skin every 3 (three) days.  10/13/19   [provider]  gabapentin (NEURONTIN) 400 MG capsule Take 1 capsule (400 mg total) by mouth 2 (two) times daily. 10/30/19   Almon Hercules, MD  HYDROcodone-acetaminophen (NORCO) 10-325 MG tablet Take 1 tablet by mouth 3 (three) times daily as needed for moderate pain or severe pain.  10/13/19   [provider]  levothyroxine (SYNTHROID) 88 MCG tablet Take 88 mcg by mouth daily. 08/23/19   [provider]  lovastatin (MEVACOR) 10 MG tablet Take 10 mg by mouth daily. 10/21/19   [provider]  senna-docusate (SENOKOT-S) 8.6-50 MG tablet Take 1 tablet by mouth 2 (two) times daily between meals as needed for mild constipation. 10/30/19   Almon Hercules, MD    Physical Exam: Vitals:   09/22/20 1348 09/22/20 1456 09/22/20 1700 09/22/20 1822  BP:  131/80 (!) 117/94 (!) 116/91  Pulse:  (!) 53 (!) 58 (!) 57  Resp:  14 (!) 21   Temp: 99.6 F (37.6 C)   98.2 F (36.8 C)  TempSrc:    Oral  SpO2:  96% 100% 97%  Weight:  54.4 kg    Height:  5\' 2"  (1.575 m)      Constitutional: NAD, calm, comfortable Vitals:   09/22/20 1348 09/22/20 1456 09/22/20 1700 09/22/20 1822  BP:  131/80 (!) 117/94 (!) 116/91  Pulse:  (!) 53 (!) 58 (!) 57  Resp:  14 (!) 21   Temp: 99.6 F (37.6 C)   98.2 F (36.8 C)  TempSrc:    Oral  SpO2:  96% 100% 97%  Weight:  54.4 kg    Height:  5\' 2"  (1.575 m)     Eyes:  PERRL, lids and conjunctivae normal ENMT: Mucous membranes are moist. Posterior pharynx clear of any exudate or lesions.Normal dentition.  Neck: normal, supple, no masses, no thyromegaly Respiratory: clear to auscultation bilaterally, no wheezing, no crackles. Normal respiratory effort. No accessory muscle use.  Cardiovascular: Regular rate and rhythm, no murmurs / rubs / gallops. No extremity edema. 2+ pedal pulses. No carotid bruits.  Abdomen: no tenderness, no masses palpated. No hepatosplenomegaly. Bowel sounds positive.  Musculoskeletal: no clubbing / cyanosis. No joint deformity upper and lower extremities. Good ROM, no contractures. Normal muscle tone.  Skin: no rashes, lesions, ulcers. No induration Neurologic: CN 2-12 grossly intact. Sensation intact, DTR normal. Strength 5/5 in all 4.  Psychiatric: Normal judgment and insight. Alert and oriented x 3. Normal mood.     Labs on Admission: I have personally reviewed following labs and imaging studies  CBC: Recent Labs  Lab 09/22/20 1343  WBC 5.7  NEUTROABS 1.8  HGB 12.8  HCT 38.4  MCV 96.5  PLT 255   Basic Metabolic Panel: Recent Labs  Lab 09/22/20 1343  NA 136  K 3.8  CL 102  CO2 28  GLUCOSE 95  BUN 13  CREATININE 0.85  CALCIUM 8.9   GFR: Estimated Creatinine Clearance: 43.1 mL/min (by C-G formula based on SCr of 0.85 mg/dL). Liver Function Tests: Recent Labs  Lab 09/22/20 1343  AST 26  ALT 18  ALKPHOS 54  BILITOT 0.4  PROT 7.3  ALBUMIN 3.7   No results for input(s): LIPASE, AMYLASE in the last 168 hours. No results for input(s): AMMONIA in the last 168 hours. Coagulation Profile: No results for input(s): INR, PROTIME in the last 168 hours. Cardiac Enzymes: No results for input(s): CKTOTAL, CKMB, CKMBINDEX, TROPONINI in the last 168 hours. BNP (last 3 results) No results for input(s): PROBNP in the last 8760 hours. HbA1C: No results for input(s): HGBA1C in the last 72 hours. CBG: Recent Labs  Lab  09/22/20 1332  GLUCAP 85   Lipid Profile: No results for input(s): CHOL, HDL, LDLCALC, TRIG, CHOLHDL, LDLDIRECT in the last 72 hours. Thyroid Function Tests: No results for input(s): TSH, T4TOTAL, FREET4, T3FREE, THYROIDAB in the last 72 hours. Anemia Panel: No results for input(s): VITAMINB12, FOLATE, FERRITIN, TIBC, IRON, RETICCTPCT in the last 72 hours. Urine analysis:    Component Value Date/Time   COLORURINE STRAW (A) 09/22/2020 1535   APPEARANCEUR CLEAR 09/22/2020 1535   LABSPEC 1.010 09/22/2020 1535   PHURINE 6.0 09/22/2020 1535   GLUCOSEU NEGATIVE 09/22/2020 1535   HGBUR NEGATIVE 09/22/2020 1535   BILIRUBINUR NEGATIVE 09/22/2020 1535   KETONESUR NEGATIVE 09/22/2020 1535  PROTEINUR NEGATIVE 09/22/2020 1535   NITRITE NEGATIVE 09/22/2020 1535   LEUKOCYTESUR SMALL (A) 09/22/2020 1535    Radiological Exams on Admission: DG Chest 2 View  Result Date: 09/22/2020 CLINICAL DATA:  Altered mental status. Slurred speech and difficulty walking. EXAM: CHEST - 2 VIEW COMPARISON:  10/28/2019 FINDINGS: The heart size and mediastinal contours are within normal limits. Both lungs are clear. The visualized skeletal structures are unremarkable. IMPRESSION: No active cardiopulmonary disease. Electronically Signed   By: Signa Kell M.D.   On: 09/22/2020 14:44   CT Head Wo Contrast  Result Date: 09/22/2020 CLINICAL DATA:  Altered mental status, slurred speech, difficulty walking, fentanyl patch EXAM: CT HEAD WITHOUT CONTRAST TECHNIQUE: Contiguous axial images were obtained from the base of the skull through the vertex without intravenous contrast. COMPARISON:  None. FINDINGS: Brain: No evidence of acute infarction, hemorrhage, hydrocephalus, extra-axial collection or mass lesion/mass effect. Periventricular and deep white matter hypodensity. Vascular: No hyperdense vessel or unexpected calcification. Skull: Normal. Negative for fracture or focal lesion. Sinuses/Orbits: No acute finding. Other:  None. IMPRESSION: No acute intracranial pathology. Small-vessel white matter disease. Electronically Signed   By: Lauralyn Primes M.D.   On: 09/22/2020 14:43    EKG: Independently reviewed.  Sinus bradycardia.  Assessment/Plan Active Problems:   Acute encephalopathy   Encephalopathy  (please populate well all problems here in Problem List. (For example, if patient is on BP meds at home and you resume or decide to hold them, it is a problem that needs to be her. Same for CAD, COPD, HLD and so on)  Acute metabolic encephalopathy -Sure whether patient overdose hydrocodone this time.  No record showed patient ever had constricted pupils, or depressed breathing.  But her symptoms improved after fentanyl patch removed. -MRI to rule out stroke, if MRI negative, likely patient can go home tomorrow. -Educated patient regarding proper use of narcotics, along with daughter at bedside, both appear to show understanding and agreement. -Check TSH, B12. A1C and Lipid panel.  Sinus bradycardia -Denied any symptoms of lightheadedness, near syncope.  Telemetry monitoring.  Chronic back pain on narcotic -As above.  Likely can resume fentanyl patch on discharge.   DVT prophylaxis: Lovenox Code Status: Full Code Family Communication: Daughter at bedside Disposition Plan: Expect less than 2 midnight hospital stay Consults called: None Admission status: Tele obs   Emeline General MD [facility] Pager [phone]  09/22/2020, 7:39 PM

## 2020-09-23 DIAGNOSIS — R4182 Altered mental status, unspecified: Secondary | ICD-10-CM

## 2020-09-23 DIAGNOSIS — G9341 Metabolic encephalopathy: Secondary | ICD-10-CM | POA: Diagnosis not present

## 2020-09-23 DIAGNOSIS — Z79899 Other long term (current) drug therapy: Secondary | ICD-10-CM | POA: Diagnosis not present

## 2020-09-23 MED ORDER — GABAPENTIN 400 MG PO CAPS: 400.0000 mg | ORAL_CAPSULE | Freq: Every day | ORAL | Status: DC

## 2020-09-23 MED ORDER — FENTANYL 12 MCG/HR TD PT72: 1.0000 | MEDICATED_PATCH | TRANSDERMAL | 0 refills | Status: AC

## 2020-09-23 MED ORDER — AMITRIPTYLINE HCL 10 MG PO TABS
10.0000 mg | ORAL_TABLET | Freq: Every day | ORAL | Status: DC
  Filled 2020-09-23: qty 1

## 2020-09-23 MED ORDER — MELATONIN 3 MG PO TABS: 6.0000 mg | ORAL_TABLET | Freq: Every day | ORAL | 0 refills | Status: AC

## 2020-09-23 MED ORDER — LEVOTHYROXINE SODIUM 100 MCG PO TABS: 100.0000 ug | ORAL_TABLET | Freq: Every day | ORAL | 0 refills | Status: AC

## 2020-09-23 MED ORDER — AMITRIPTYLINE HCL 10 MG PO TABS: 10.0000 mg | ORAL_TABLET | Freq: Every day | ORAL | 0 refills | Status: AC

## 2020-09-23 MED ORDER — LEVOTHYROXINE SODIUM 100 MCG PO TABS: 100.0000 ug | ORAL_TABLET | Freq: Every day | ORAL | Status: DC

## 2020-09-23 MED ORDER — FENTANYL 12 MCG/HR TD PT72
1.0000 | MEDICATED_PATCH | TRANSDERMAL | Status: DC
Start: 2020-09-23 — End: 2020-09-23

## 2020-09-23 MED ORDER — GABAPENTIN 400 MG PO CAPS: 400.0000 mg | ORAL_CAPSULE | Freq: Every day | ORAL | Status: AC

## 2020-09-23 MED ORDER — DICLOFENAC SODIUM 1 % EX GEL: 2.0000 g | Freq: Four times a day (QID) | CUTANEOUS | Status: AC

## 2020-09-23 MED ORDER — DICLOFENAC SODIUM 1 % EX GEL
2.0000 g | Freq: Four times a day (QID) | CUTANEOUS | Status: DC
  Filled 2020-09-23: qty 100

## 2020-09-23 NOTE — Discharge Summary (Addendum)
Physician Discharge Summary  Nina White TFT:[id number] DOB: [birth year] DOA: 09/22/2020  PCP: Leola Brazil, DO  Admit date: 09/22/2020 Discharge date: 09/23/2020  Admitted From: home Discharge disposition: home   Recommendations for Outpatient Follow-Up:  Polypharmacy: discussed with pharmacy and these were their recommendations - Advise patient to STOP taking cimetidine. START famotidine 20 mg daily. (Significant DDIs w/ melatonin and amitriptyline) - Reduce amitriptyline dose to 10 mg and discuss taper off of this medication outpatient (patient likes to take this medication for sleep as well as some benefit in neuropathic pain, however may also be contributing to recent memory problems) - Advise patient to dispose of Baclofen if taking old prescription AND STOP taking this medication altogether - Advise patient to take gabapentin 400 mg ONCE daily as reported on medication history - REDUCE fentanyl dose to 12 mcg patch Q72H - CONTINUE Norco 10/325. Advise patient to use sparingly. May use additional 2g Tylenol (APAP) PRN for additional pain based on APAP content of Norco. - DECREASE melatonin dose to 6mg  QHS (patient was likely getting greater exposure to this due to cimetidine usage)   Outpatient PT Encouraged family to become more involved with medications and doctor appointments Consider referral to neurology for neurocognitive evaluation   Discharge Diagnosis:   Active Problems:   Acute encephalopathy   Encephalopathy   Altered mental status    Discharge Condition: Improved.  Diet recommendation:  Regular.  Wound care: None.  Code status: Full.   History of Present Illness:   Nina White is a 80 y.o. female with medical history significant of chronic back pain on narcotics,, presented with after mentations.   Patient does not recall what happened.  History provided by daughter at bedside.  Patient was last seen normal last night.  This  morning, patient called son asking "come to 2016" which does not make any sense to the patient family.  Family found patient in her room, with her hydrocodone pillbox open, and pills scattered.  Patient had slurred speech, eyes staring.   At the time I saw the patient, her mentation has recovered to her baseline.  She has no confusions, but she does not recall what happened this morning.  Denies any numbness weakness of any of the limbs, no slurred speech.     Hospital Course by Problem:   Acute metabolic encephalopathy -resolved -thought to be due to medications -see above re: polypharmacy -doubtful for seizures  ? Memory issues -asked family to get more involved with her medications/doctors appointments -neurology referral for neurocognitive eval  Elevated TSH -says she has been taking her meds -increase synthroid -outpatient tSH   Chronic back pain on narcotic -see above re polypharmacy and pharmacy recommendations   Incidental finding: .9 cm meningioma overlying the anterior left frontal convexity without significant mass effect or edema -neurology follow up outpatient    Medical Consultants:      Discharge Exam:   Vitals:   09/23/20 0720 09/23/20 1137  BP: 137/72 (!) 149/80  Pulse: 60 67  Resp: 16 16  Temp: 98.6 F (37 C) 98.9 F (37.2 C)  SpO2: 98% 98%   Vitals:   09/23/20 0005 09/23/20 0403 09/23/20 0720 09/23/20 1137  BP: 133/77 133/74 137/72 (!) 149/80  Pulse: (!) 53 61 60 67  Resp: 16 16 16 16   Temp: 97.9 F (36.6 C) (!) 97.5 F (36.4 C) 98.6 F (37 C) 98.9 F (37.2 C)  TempSrc: Oral Oral Oral Oral  SpO2:  98% 97% 98% 98%  Weight:      Height:        General exam: Appears calm and comfortable.     The results of significant diagnostics from this hospitalization (including imaging, microbiology, ancillary and laboratory) are listed below for reference.     Procedures and Diagnostic Studies:   DG Chest 2 View  Result Date:  09/22/2020 CLINICAL DATA:  Altered mental status. Slurred speech and difficulty walking. EXAM: CHEST - 2 VIEW COMPARISON:  10/28/2019 FINDINGS: The heart size and mediastinal contours are within normal limits. Both lungs are clear. The visualized skeletal structures are unremarkable. IMPRESSION: No active cardiopulmonary disease. Electronically Signed   By: Signa Kell M.D.   On: 09/22/2020 14:44   CT Head Wo Contrast  Result Date: 09/22/2020 CLINICAL DATA:  Altered mental status, slurred speech, difficulty walking, fentanyl patch EXAM: CT HEAD WITHOUT CONTRAST TECHNIQUE: Contiguous axial images were obtained from the base of the skull through the vertex without intravenous contrast. COMPARISON:  None. FINDINGS: Brain: No evidence of acute infarction, hemorrhage, hydrocephalus, extra-axial collection or mass lesion/mass effect. Periventricular and deep white matter hypodensity. Vascular: No hyperdense vessel or unexpected calcification. Skull: Normal. Negative for fracture or focal lesion. Sinuses/Orbits: No acute finding. Other: None. IMPRESSION: No acute intracranial pathology. Small-vessel white matter disease. Electronically Signed   By: Lauralyn Primes M.D.   On: 09/22/2020 14:43   MR BRAIN WO CONTRAST  Result Date: 09/22/2020 CLINICAL DATA:  Initial evaluation for acute delirium. EXAM: MRI HEAD WITHOUT CONTRAST TECHNIQUE: Multiplanar, multiecho pulse sequences of the brain and surrounding structures were obtained without intravenous contrast. COMPARISON:  Prior CT from earlier the same day. FINDINGS: Brain: Generalized age-related cerebral atrophy with mild chronic small vessel ischemic disease. No abnormal foci of restricted diffusion to suggest acute or subacute ischemia. Gray-white matter differentiation maintained. No encephalomalacia to suggest chronic cortical infarction. No acute intracranial hemorrhage. Small focus of chronic hemosiderin staining present at the anterior left frontal lobe,  suggesting prior hemorrhage at this location (series 7, image 62). 1.0 x 1.9 cm meningioma overlies the anterior left frontal convexity without significant mass effect or edema (series 5, image 17). No other mass lesion, mass effect, or midline shift. Ventricular prominence related to global parenchymal volume loss of hydrocephalus. No extra-axial fluid collection. Pituitary gland suprasellar region within normal limits. Midline structures intact. Vascular: Major intracranial vascular flow voids are maintained. Skull and upper cervical spine: Craniocervical junction within normal limits. Reversal of the normal cervical lordosis with associated multilevel degenerative spondylosis without high-grade spinal stenosis noted within the upper cervical spine. Bone marrow signal intensity within normal limits. No scalp soft tissue abnormality. Sinuses/Orbits: Prior ocular lens replacement on the left. Globes and orbital soft tissues demonstrate no acute finding. Sinus. No mastoid effusion. Inner ear structures grossly normal. About the ethmoidal air cells and left maxillary Other: None. IMPRESSION: 1. No acute intracranial abnormality. 2. Generalized age-related cerebral atrophy with mild chronic small vessel ischemic disease. 3. Focus of chronic hemosiderin staining involving the anterior left frontal lobe, suggesting prior/remote hemorrhage at this location. 4. 1.9 cm meningioma overlying the anterior left frontal convexity without significant mass effect or edema. Electronically Signed   By: Rise Mu M.D.   On: 09/22/2020 22:24     Labs:   Basic Metabolic Panel: Recent Labs  Lab 09/22/20 1343  NA 136  K 3.8  CL 102  CO2 28  GLUCOSE 95  BUN 13  CREATININE 0.85  CALCIUM 8.9  GFR Estimated Creatinine Clearance: 43.1 mL/min (by C-G formula based on SCr of 0.85 mg/dL). Liver Function Tests: Recent Labs  Lab 09/22/20 1343  AST 26  ALT 18  ALKPHOS 54  BILITOT 0.4  PROT 7.3  ALBUMIN 3.7    No results for input(s): LIPASE, AMYLASE in the last 168 hours. No results for input(s): AMMONIA in the last 168 hours. Coagulation profile No results for input(s): INR, PROTIME in the last 168 hours.  CBC: Recent Labs  Lab 09/22/20 1343  WBC 5.7  NEUTROABS 1.8  HGB 12.8  HCT 38.4  MCV 96.5  PLT 255   Cardiac Enzymes: No results for input(s): CKTOTAL, CKMB, CKMBINDEX, TROPONINI in the last 168 hours. BNP: Invalid input(s): POCBNP CBG: Recent Labs  Lab 09/22/20 1332  GLUCAP 85   D-Dimer No results for input(s): DDIMER in the last 72 hours. Hgb A1c Recent Labs    09/22/20 2038  HGBA1C 6.0*   Lipid Profile Recent Labs    09/22/20 2038  CHOL 165  HDL 56  LDLCALC 88  TRIG 106  CHOLHDL 2.9   Thyroid function studies Recent Labs    09/22/20 2038  TSH 8.000*   Anemia work up Recent Labs    09/22/20 2038  VITAMINB12 2,266*   Microbiology Recent Results (from the past 240 hour(s))  Resp Panel by RT-PCR (Flu A&B, Covid) Nasopharyngeal Swab     Status: None   Collection Time: 09/22/20  1:43 PM   Specimen: Nasopharyngeal Swab; Nasopharyngeal(NP) swabs in vial transport medium  Result Value Ref Range Status   SARS Coronavirus 2 by RT PCR NEGATIVE NEGATIVE Final    Comment: (NOTE) SARS-CoV-2 target nucleic acids are NOT DETECTED.  The SARS-CoV-2 RNA is generally detectable in upper respiratory specimens during the acute phase of infection. The lowest concentration of SARS-CoV-2 viral copies this assay can detect is 138 copies/mL. A negative result does not preclude SARS-Cov-2 infection and should not be used as the sole basis for treatment or other patient management decisions. A negative result may occur with  improper specimen collection/handling, submission of specimen other than nasopharyngeal swab, presence of viral mutation(s) within the areas targeted by this assay, and inadequate number of viral copies(<138 copies/mL). A negative result must be  combined with clinical observations, patient history, and epidemiological information. The expected result is Negative.  Fact Sheet for Patients:  BloggerCourse.com  Fact Sheet for Healthcare Providers:  SeriousBroker.it  This test is no t yet approved or cleared by the [place] FDA and  has been authorized for detection and/or diagnosis of SARS-CoV-2 by FDA under an Emergency Use Authorization (EUA). This EUA will remain  in effect (meaning this test can be used) for the duration of the COVID-19 declaration under Section 564(b)(1) of the Act, 21 U.S.C.section 360bbb-3(b)(1), unless the authorization is terminated  or revoked sooner.       Influenza A by PCR NEGATIVE NEGATIVE Final   Influenza B by PCR NEGATIVE NEGATIVE Final    Comment: (NOTE) The Xpert Xpress SARS-CoV-2/FLU/RSV plus assay is intended as an aid in the diagnosis of influenza from Nasopharyngeal swab specimens and should not be used as a sole basis for treatment. Nasal washings and aspirates are unacceptable for Xpert Xpress SARS-CoV-2/FLU/RSV testing.  Fact Sheet for Patients: BloggerCourse.com  Fact Sheet for Healthcare Providers: SeriousBroker.it  This test is not yet approved or cleared by the [place] FDA and has been authorized for detection and/or diagnosis of SARS-CoV-2 by FDA under an Emergency Use  Authorization (EUA). This EUA will remain in effect (meaning this test can be used) for the duration of the COVID-19 declaration under Section 564(b)(1) of the Act, 21 U.S.C. section 360bbb-3(b)(1), unless the authorization is terminated or revoked.  Performed at [facility], [address] [place], [place] [address]      Discharge Instructions:   Discharge Instructions     Diet general   Complete by: As directed    Discharge instructions   Complete by: As directed     Recommendations from pharmacy: Plan:  - STOP taking cimetidine. START famotidine 20 mg daily. (Cimetidine has Significant interactions w/ melatonin and amitriptyline) - Reduce amitriptyline dose to 10 mg and discuss taper off of this medication outpatient ( may be contributing to recent memory problems) - Advise patient to dispose of Baclofen if taking old prescription AND STOP taking this medication altogether - Advise patient to take gabapentin 400 mg ONCE daily as reported on medication history - REDUCE fentanyl dose to 12 mcg patch Q72H - CONTINUE Norco 10/325.  use sparingly. May use additional 2g Tylenol (APAP) PRN for additional pain (can take a total of 4 grams of tylenol/day) - DECREASE melatonin dose to  QHS (was likely getting greater exposure to this due to cimetidine usage)    Can also use OTC lidocaine patches for help with pain control Outpatient PT to help increase activity Life alert Could consider a time-locked medication dispenser Encourage family to become more involved with medications/medication management as well as going to doctor appointments with patient Your TSH was elevated-- be sure to take your synthroid first think in the AM on an empty stomach   Increase activity slowly   Complete by: As directed       Allergies as of 09/23/2020       Reactions   Infliximab Swelling   Lower lip swelling   Lisinopril Swelling   Lower lip swelling   Erythromycin Swelling   Facial swelling   Penicillin G Swelling   Sulfamethoxazole Other (See Comments)   Unknown reaction   Tetracyclines & Related Swelling   Facial swelling        Medication List     STOP taking these medications    acyclovir 200 MG capsule Commonly known as: ZOVIRAX   fentaNYL 25 MCG/HR Commonly known as: DURAGESIC Replaced by: fentaNYL 12 MCG/HR   TAGAMET PO       TAKE these medications    acetaminophen 500 MG tablet Commonly known as: TYLENOL Take 500 mg by mouth every 6  (six) hours as needed (pain).   amitriptyline 10 MG tablet Commonly known as: ELAVIL Take 1 tablet (10 mg total) by mouth at bedtime. What changed:  medication strength how much to take   AZO-STANDARD PO Take 1 tablet by mouth daily.   Biotin 5000 MCG Tabs Take 5,000 mcg by mouth daily.   brimonidine 0.1 % Soln Commonly known as: ALPHAGAN P Place 1 drop into both eyes See admin instructions. Instil one drop into both eyes once or twice daily What changed: Another medication with the same name was removed. Continue taking this medication, and follow the directions you see here.   CITRACAL PO Take 1 tablet by mouth daily.   COQ10 PO Take 1 capsule by mouth daily.   diclofenac Sodium 1 % Gel Commonly known as: VOLTAREN Apply 2 g topically 4 (four) times daily.   fentaNYL 12 MCG/HR Commonly known as: DURAGESIC Place 1 patch onto the skin  every 3 (three) days. Replaces: fentaNYL 25 MCG/HR   FISH OIL TRIPLE STRENGTH PO Take 1,576 mg by mouth daily.   gabapentin 400 MG capsule Commonly known as: NEURONTIN Take 1 capsule (400 mg total) by mouth at bedtime.   HYDROcodone-acetaminophen 10-325 MG tablet Commonly known as: NORCO Take 1 tablet by mouth 3 (three) times daily as needed for moderate pain or severe pain.   levothyroxine 100 MCG tablet Commonly known as: SYNTHROID Take 1 tablet (100 mcg total) by mouth daily. Start taking on: September 24, 2020 What changed:  medication strength how much to take   lovastatin 10 MG tablet Commonly known as: MEVACOR Take 10 mg by mouth daily.   melatonin 3 MG Tabs tablet Take 2 tablets (6 mg total) by mouth at bedtime. What changed: how much to take   omeprazole 20 MG tablet Commonly known as: PRILOSEC OTC Take 20 mg by mouth daily.   Orencia 250 MG injection Generic drug: abatacept Inject 500 mg into the vein every 28 (twenty-eight) days.   PREVAGEN PO Take 1 capsule by mouth daily.   PROBIOTIC PO Take 1 tablet by  mouth daily.   senna-docusate 8.6-50 MG tablet Commonly known as: Senokot-S Take 1 tablet by mouth 2 (two) times daily between meals as needed for mild constipation.   Vitamin B-12 5000 MCG Tbdp Take 5,000 mcg by mouth daily.   VITAMIN C PO Take 1 tablet by mouth daily.        Follow-up Information     Hope Pigeon B, DO Follow up in 1 week(s).   Specialty: Internal Medicine Why: discuss polypharmacy/medications will need to recheck [facility] Contact information: [address] [place] [address] [phone]         Sheran Luz, MD. Schedule an appointment as soon as possible for a visit.   Specialty: Physical Medicine and Rehabilitation Contact information: [address] [place] [address] [place] [address] [phone]                  Time coordinating discharge: 35 min  Signed:  Joseph Art DO  [facility] 09/23/2020, 12:41 PM

## 2020-09-23 NOTE — Progress Notes (Signed)
MEDICATION RELATED CONSULT NOTE - INITIAL   Pharmacy Consult for Medication Review Indication: polypharmacy   Allergies  Allergen Reactions   Infliximab Swelling    Lower lip swelling     Lisinopril Swelling    Lower lip swelling     Erythromycin Swelling    Facial swelling   Penicillin G Swelling   Sulfamethoxazole Other (See Comments)    Unknown reaction   Tetracyclines & Related Swelling    Facial swelling    Patient Measurements: Height: 5\' 2"  (157.5 cm) Weight: 54.4 kg (120 lb) IBW/kg (Calculated) : 50.1   Vital Signs: Temp: 98.6 F (37 C) (07/30 0720) Temp Source: Oral (07/30 0720) BP: 137/72 (07/30 0720) Pulse Rate: 60 (07/30 0720) Intake/Output from previous day: No intake/output data recorded. Intake/Output from this shift: No intake/output data recorded.  Labs: Recent Labs    09/22/20 1343  WBC 5.7  HGB 12.8  HCT 38.4  PLT 255  CREATININE 0.85  ALBUMIN 3.7  PROT 7.3  AST 26  ALT 18  ALKPHOS 54  BILITOT 0.4   Estimated Creatinine Clearance: 43.1 mL/min (by C-G formula based on SCr of 0.85 mg/dL).   Microbiology: Recent Results (from the past 720 hour(s))  Resp Panel by RT-PCR (Flu A&B, Covid) Nasopharyngeal Swab     Status: None   Collection Time: 09/22/20  1:43 PM   Specimen: Nasopharyngeal Swab; Nasopharyngeal(NP) swabs in vial transport medium  Result Value Ref Range Status   SARS Coronavirus 2 by RT PCR NEGATIVE NEGATIVE Final    Comment: (NOTE) SARS-CoV-2 target nucleic acids are NOT DETECTED.  The SARS-CoV-2 RNA is generally detectable in upper respiratory specimens during the acute phase of infection. The lowest concentration of SARS-CoV-2 viral copies this assay can detect is 138 copies/mL. A negative result does not preclude SARS-Cov-2 infection and should not be used as the sole basis for treatment or other patient management decisions. A negative result may occur with  improper specimen collection/handling, submission of  specimen other than nasopharyngeal swab, presence of viral mutation(s) within the areas targeted by this assay, and inadequate number of viral copies(<138 copies/mL). A negative result must be combined with clinical observations, patient history, and epidemiological information. The expected result is Negative.  Fact Sheet for Patients:  BloggerCourse.com  Fact Sheet for Healthcare Providers:  SeriousBroker.it  This test is no t yet approved or cleared by the [place] FDA and  has been authorized for detection and/or diagnosis of SARS-CoV-2 by FDA under an Emergency Use Authorization (EUA). This EUA will remain  in effect (meaning this test can be used) for the duration of the COVID-19 declaration under Section 564(b)(1) of the Act, 21 U.S.C.section 360bbb-3(b)(1), unless the authorization is terminated  or revoked sooner.       Influenza A by PCR NEGATIVE NEGATIVE Final   Influenza B by PCR NEGATIVE NEGATIVE Final    Comment: (NOTE) The Xpert Xpress SARS-CoV-2/FLU/RSV plus assay is intended as an aid in the diagnosis of influenza from Nasopharyngeal swab specimens and should not be used as a sole basis for treatment. Nasal washings and aspirates are unacceptable for Xpert Xpress SARS-CoV-2/FLU/RSV testing.  Fact Sheet for Patients: BloggerCourse.com  Fact Sheet for Healthcare Providers: SeriousBroker.it  This test is not yet approved or cleared by the [place] FDA and has been authorized for detection and/or diagnosis of SARS-CoV-2 by FDA under an Emergency Use Authorization (EUA). This EUA will remain in effect (meaning this test can be used) for the duration of  the COVID-19 declaration under Section 564(b)(1) of the Act, 21 U.S.C. section 360bbb-3(b)(1), unless the authorization is terminated or revoked.  Performed at [facility], [address] [place], [place] [address]     Medical History: Past Medical History:  Diagnosis Date   Arthritis    Back pain    H/O degenerative disc disease     Medications:  Medications Prior to Admission  Medication Sig Dispense Refill Last Dose   abatacept (ORENCIA) 250 MG injection Inject 500 mg into the vein every 28 (twenty-eight) days.   7/7?   acetaminophen (TYLENOL) 500 MG tablet Take 500 mg by mouth every 6 (six) hours as needed (pain).   Past Week   amitriptyline (ELAVIL) 25 MG tablet Take 25 mg by mouth at bedtime.    09/21/2020   Apoaequorin (PREVAGEN PO) Take 1 capsule by mouth daily.   09/21/2020   Ascorbic Acid (VITAMIN C PO) Take 1 tablet by mouth daily.   09/21/2020   Biotin 5000 MCG TABS Take 5,000 mcg by mouth daily.   09/21/2020   brimonidine (ALPHAGAN P) 0.1 % SOLN Place 1 drop into both eyes See admin instructions. Instil one drop into both eyes once or twice daily   09/21/2020   Calcium Citrate (CITRACAL PO) Take 1 tablet by mouth daily.   09/21/2020   Cimetidine (TAGAMET PO) Take 1 tablet by mouth 2 (two) times daily as needed (acid reflux/heartburn).   Past Week   Coenzyme Q10 (COQ10 PO) Take 1 capsule by mouth daily.   09/21/2020   Cyanocobalamin (VITAMIN B-12) 5000 MCG TBDP Take 5,000 mcg by mouth daily.   09/21/2020   fentaNYL (DURAGESIC) 25 MCG/HR Place 1 patch onto the skin every 3 (three) days.    Past Week   gabapentin (NEURONTIN) 400 MG capsule Take 1 capsule (400 mg total) by mouth 2 (two) times daily. (Patient taking differently: Take 400 mg by mouth at bedtime.)   09/21/2020   HYDROcodone-acetaminophen (NORCO) 10-325 MG tablet Take 1 tablet by mouth 3 (three) times daily as needed for moderate pain or severe pain.   09/21/2020   levothyroxine (SYNTHROID) 88 MCG tablet Take 88 mcg by mouth daily.   09/21/2020   lovastatin (MEVACOR) 10 MG tablet Take 10 mg by mouth daily.   past week?   melatonin 3 MG TABS tablet Take 9 mg by mouth at bedtime.   09/21/2020   Omega-3 Fatty  Acids (FISH OIL TRIPLE STRENGTH PO) Take 1,576 mg by mouth daily.   09/21/2020   omeprazole (PRILOSEC OTC) 20 MG tablet Take 20 mg by mouth daily.   09/21/2020   Phenazopyridine HCl (AZO-STANDARD PO) Take 1 tablet by mouth daily.   09/21/2020   Probiotic Product (PROBIOTIC PO) Take 1 tablet by mouth daily.   09/21/2020   acyclovir (ZOVIRAX) 200 MG capsule Take 200 mg by mouth daily.  (Patient not taking: No sig reported)   Not Taking   brimonidine (ALPHAGAN) 0.2 % ophthalmic solution Place 1 drop into both eyes in the morning and at bedtime.  (Patient not taking: No sig reported)   Not Taking   senna-docusate (SENOKOT-S) 8.6-50 MG tablet Take 1 tablet by mouth 2 (two) times daily between meals as needed for mild constipation. (Patient not taking: No sig reported) 60 tablet 0 Not Taking    Assessment: Patient is on multiple centrally-acting medications including fentanyl patch, PRN hydrocodone-acetaminophen, gabapentin, amitriptyline, melatonin,  and possible baclofen. Patient is also on cimetidine which has  significant drug interactions with multiple medications and may increase her level of sedation.   The patient was started on these medications for chronic, right-sided low back pain with pain radiating into her leg. Patient also has PMH significant for RA, treated with abatacept. Patient also has a h/o CKD with CrCL 43 mL/min. Multiple admissions in past for altered mental status at which time she was advised to reduce Norco frequency, reduce dosage of gabapentin, and discuss narcotic use with PCP. Baclofen was discontinued 10/2019 by inpatient provider, however baclofen was subsequently filled 04/2020. Unclear if patient is still taking baclofen. Additionally, although gabapentin dose was reduced to 400 mg daily by the same inpatient provider, clinic notes state she is taking 1200 mg daily (appropriate dose based on renal function <900 mg)   Goal of Therapy:  Reduce the number of centrally-acting  medications without significant increases in pain, to prevent future admissions for altered mental status and reduce risk of falls.   Plan:  - Advise patient to STOP taking cimetidine. START famotidine 20 mg daily. (Significant DDIs w/ melatonin and amitriptyline)  - Reduce amitriptyline dose to 10 mg and discuss taper off of this medication outpatient (patient likes to take this medication for sleep as well as some benefit in neuropathic pain, however may also be contributing to recent memory problems) - Advise patient to dispose of Baclofen if taking old prescription AND STOP taking this medication altogether  - Advise patient to take gabapentin 400 mg ONCE daily as reported on medication history  - REDUCE fentanyl dose to 12 mcg patch Q72H  - CONTINUE Norco 10/325. Advise patient to use sparingly. May use additional 2g Tylenol (APAP) PRN for additional pain based on APAP content of Norco.  - DECREASE melatonin dose to 6mg  QHS (patient was likely getting greater exposure to this due to cimetidine usage)   , PharmD PGY-1 Acute Care Resident  09/23/2020 10:57 AM

## 2020-09-23 NOTE — Evaluation (Signed)
Physical Therapy Evaluation Patient Details Name: Nina White MRN: [id number] DOB: 1941-01-12 Today's Date: 09/23/2020   History of Present Illness  80 y/o female presented to ED on 7/29 for confusion, slurred speech, and difficulty walking. Head CT negative. MRI negative. Patient on opioids and fentanyl patch for chronic back pain. Suspect AMS due to polypharmacy. PMH: chronic back pain on opioids, HLD, pre-hypertension  Clinical Impression  PTA, patient lives with husband and son and reports independence with mobility and ADLs. Son reports unsteadiness during mobility and patient is home alone most of the day and lives sedentary lifestyle. Patient currently functioning at supervision level for ambulation with no AD due to mild balance deficits. Patient negotiated flight of stairs with supervision and L handrail. Patient presents with generalized weakness, decreased activity tolerance, impaired balance, and decreased awareness of safety and deficits. Patient will benefit from skilled PT services during acute stay to address listed deficits. Recommend OPPT following discharge to address strength, balance, and endurance deficits.     Follow Up Recommendations Outpatient PT;Supervision for mobility/OOB    Equipment Recommendations  None recommended by PT    Recommendations for Other Services       Precautions / Restrictions Precautions Precautions: Fall Restrictions Weight Bearing Restrictions: No      Mobility  Bed Mobility Overal bed mobility: Modified Independent                  Transfers Overall transfer level: Needs assistance Equipment used: None Transfers: Sit to/from Stand Sit to Stand: Supervision         General transfer comment: supervision for safety. Unsteady initially but able to self correct  Ambulation/Gait Ambulation/Gait assistance: Supervision Gait Distance (Feet): 200 Feet Assistive device: None Gait Pattern/deviations: Decreased stride  length;Drifts right/left Gait velocity: decreased   General Gait Details: drifting L/R throughout. Unsteady with head turns and scanning environment but able to self correct minor LOBs. Supervision for safety  Stairs Stairs: Yes Stairs assistance: Supervision Stair Management: One rail Left;Alternating pattern;Forwards Number of Stairs: 10 General stair comments: supervision for safety. Reliant on Charity fundraiser [employer] (Stroke Patients Only)       Balance Overall balance assessment: Mild deficits observed, not formally tested                                           Pertinent Vitals/Pain Pain Assessment: No/denies pain    Home Living Family/patient expects to be discharged to:: Private residence Living Arrangements: Spouse/significant other;Children Available Help at Discharge: Family;Available PRN/intermittently Type of Home: House Home Access: Level entry     Home Layout: Bed/bath upstairs;Multi-level Home Equipment: Nina White - 2 wheels;Cane - single point      Prior Function Level of Independence: Independent         Comments: son reports patient lives sedentary lifestyle     Hand Dominance        Extremity/Trunk Assessment   Upper Extremity Assessment Upper Extremity Assessment: Overall WFL for tasks assessed    Lower Extremity Assessment Lower Extremity Assessment: Overall WFL for tasks assessed    Cervical / Trunk Assessment Cervical / Trunk Assessment: Kyphotic  Communication   Communication: No difficulties  Cognition Arousal/Alertness: Awake/alert Behavior During Therapy: WFL for tasks assessed/performed Overall Cognitive Status: Impaired/Different from baseline Area of Impairment: Awareness;Attention;Safety/judgement  Current Attention Level: Selective     Safety/Judgement: Decreased awareness of deficits;Decreased awareness of safety Awareness: Intellectual    General Comments: decreased awareness into situation stating "they tell me I was confused but I wasn't at all"      General Comments      Exercises     Assessment/Plan    PT Assessment Patient needs continued PT services  PT Problem List Decreased balance;Decreased strength;Decreased activity tolerance;Decreased cognition;Decreased safety awareness       PT Treatment Interventions DME instruction;Gait training;Stair training;Functional mobility training;Therapeutic activities;Therapeutic exercise;Balance training    PT Goals (Current goals can be found in the Care Plan section)  Acute Rehab PT Goals Patient Stated Goal: to go home PT Goal Formulation: With patient/family Time For Goal Achievement: 10/07/20 Potential to Achieve Goals: Good    Frequency Min 3X/week   Barriers to discharge        Co-evaluation               AM-PAC PT "6 Clicks" Mobility  Outcome Measure Help needed turning from your back to your side while in a flat bed without using bedrails?: None Help needed moving from lying on your back to sitting on the side of a flat bed without using bedrails?: None Help needed moving to and from a bed to a chair (including a wheelchair)?: A Little Help needed standing up from a chair using your arms (e.g., wheelchair or bedside chair)?: A Little Help needed to walk in hospital room?: A Little Help needed climbing 3-5 steps with a railing? : A Little 6 Click Score: 20    End of Session Equipment Utilized During Treatment: Gait belt Activity Tolerance: Patient tolerated treatment well Patient left: in bed;with call bell/phone within reach;with bed alarm set;with family/visitor present Nurse Communication: Mobility status PT Visit Diagnosis: Unsteadiness on feet (R26.81)    Time: 9371-6967 PT Time Calculation (min) (ACUTE ONLY): 23 min   Charges:   PT Evaluation $PT Eval Low Complexity: 1 Low          Kaitlin Ardito A. Dan Humphreys PT, DPT Acute  Rehabilitation Services Pager [phone] Office [phone]   Viviann Spare 09/23/2020, 10:45 AM

## 2020-09-24 ENCOUNTER — Telehealth: Payer: Self-pay

## 2020-09-24 NOTE — Telephone Encounter (Signed)
Ms toler called in for patient and  stated they have all prescriptions except voltaren Gel. She will see if it is over the counter, if not will call in to preferred pharmacy

## 2020-10-13 NOTE — Telephone Encounter (Signed)
error 

## 2021-08-11 IMAGING — CT CT HEAD W/O CM
3 series · 16 of 47 positions shown, 19 images · non-contrast
Comparison: None.

CLINICAL DATA: Altered mental status, slurred speech, difficulty
walking, fentanyl patch

EXAM:
CT HEAD WITHOUT CONTRAST
TECHNIQUE: Contiguous axial images were obtained from the base of the skull
through the vertex without intravenous contrast.

[Series 2: head wo · axial · 0.40mm/px · z∈[-198,-58]mm · 10 of 34 slices shown, 13 images]
[im 3/34  brain]
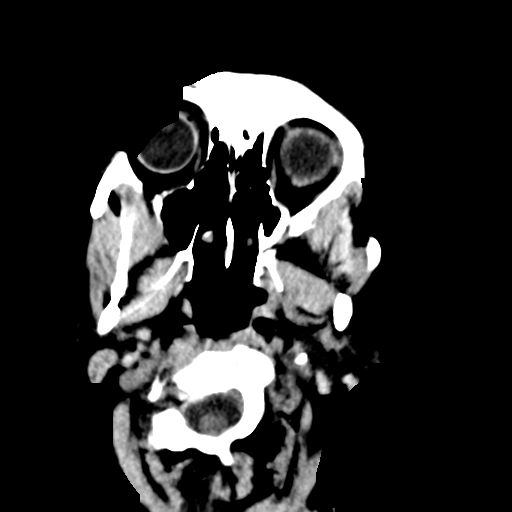
[im 3/34  bone]
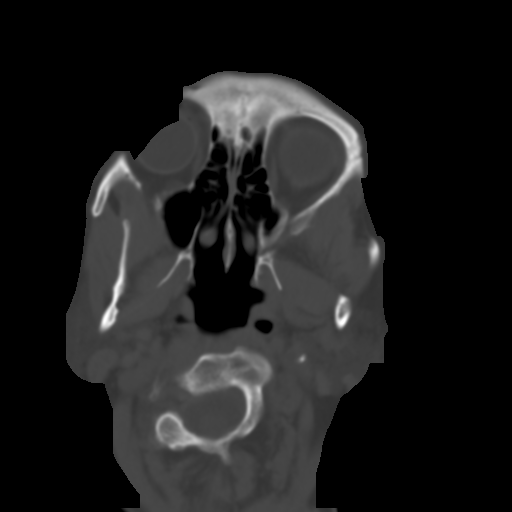
[im 6/34  brain]
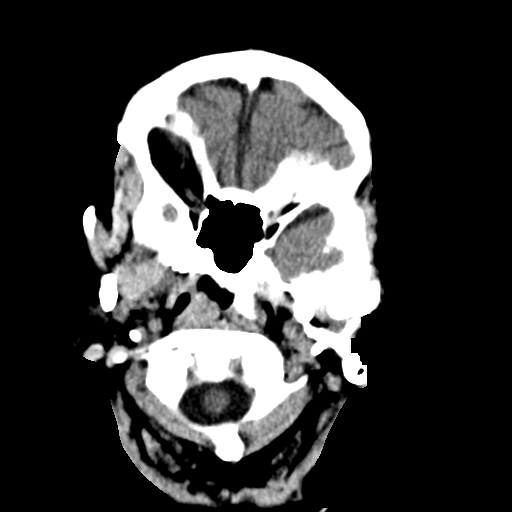
[im 10/34  brain]
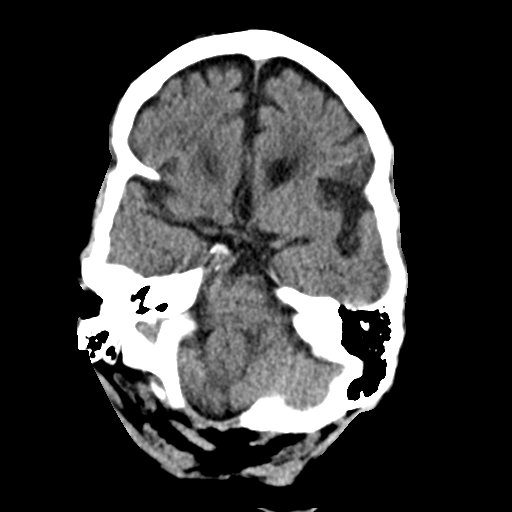
[im 12/34  brain]
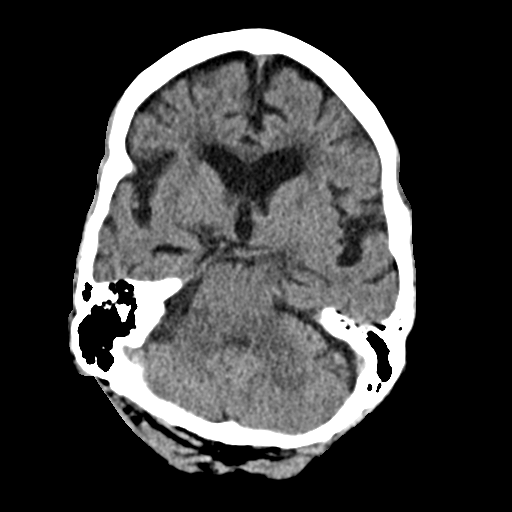
[im 15/34  brain]
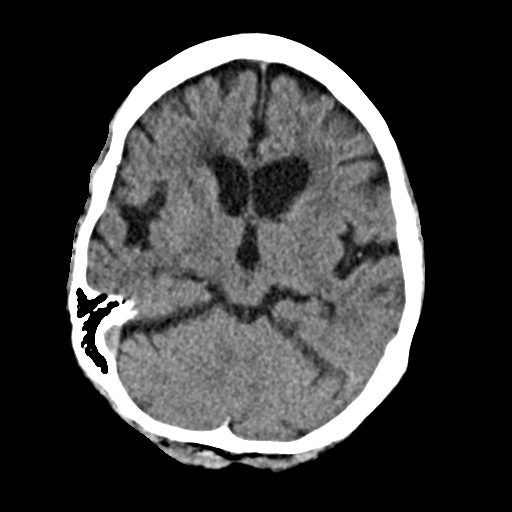
[im 15/34  bone]
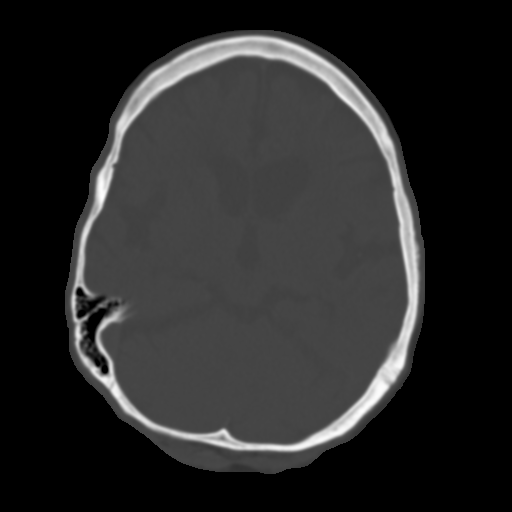
[im 19/34  brain]
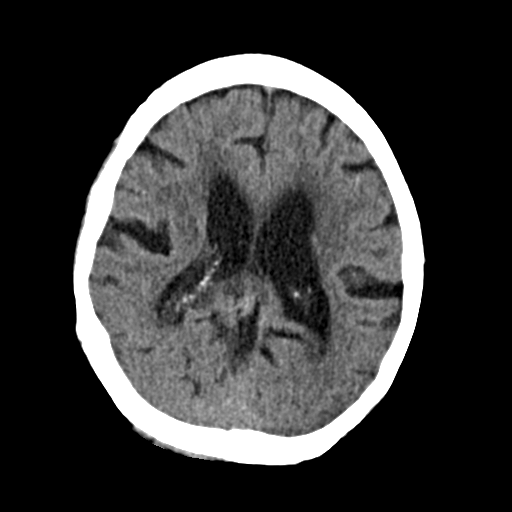
[im 22/34  brain]
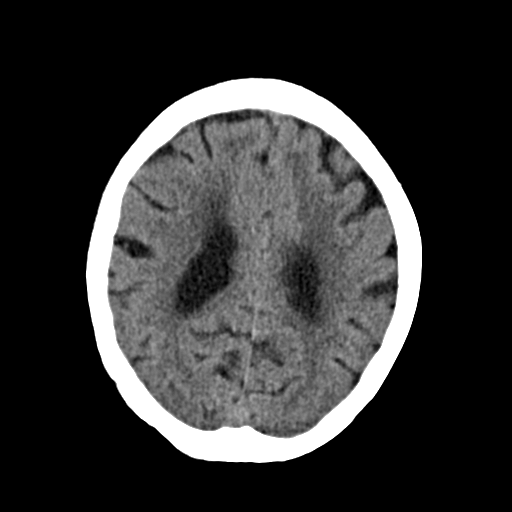
[im 26/34  brain]
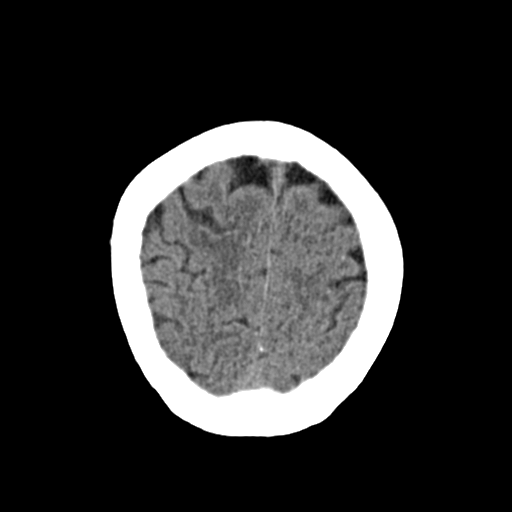
[im 28/34  brain]
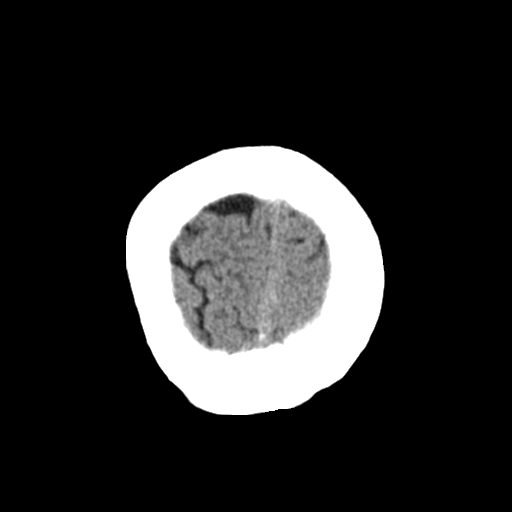
[im 28/34  bone]
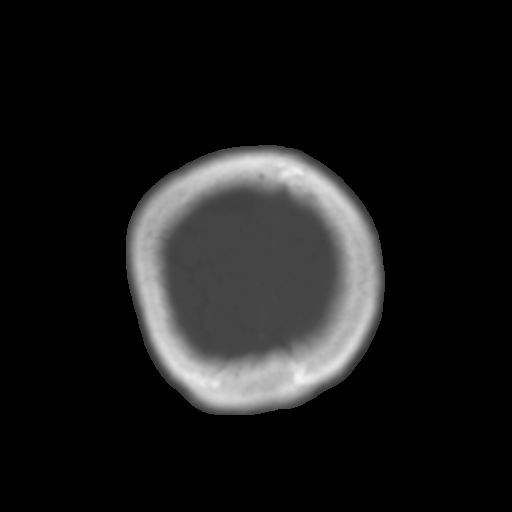
[im 31/34  brain]
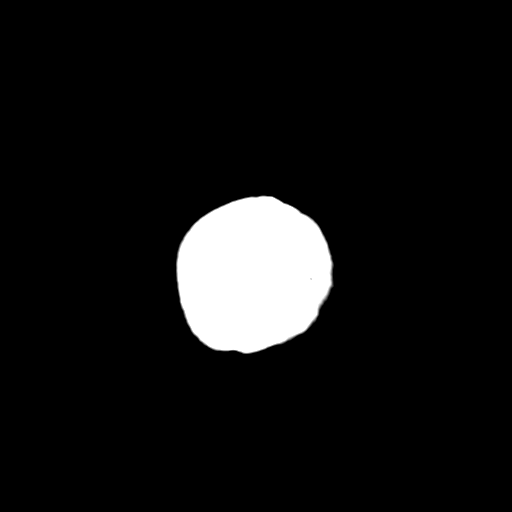

[Series 4: coronal soft · coronal · 0.33mm/px · 3 of 66 slices shown]
[im 22/66  brain]
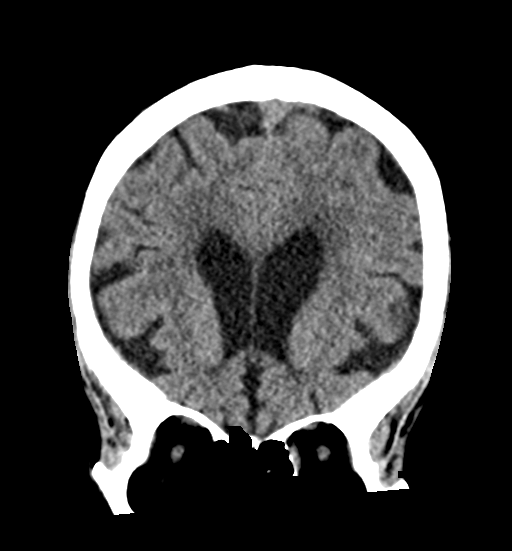
[im 29/66  brain]
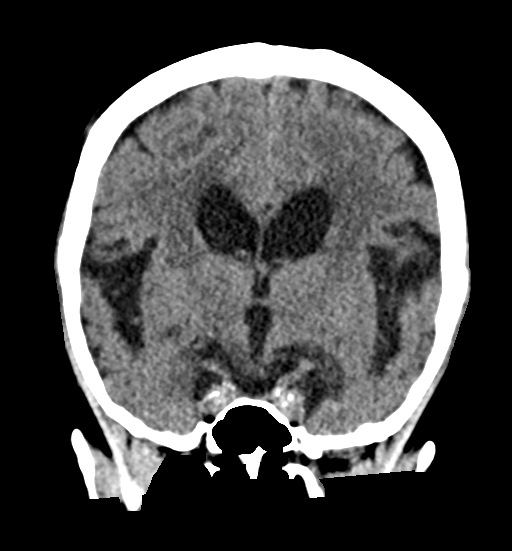
[im 37/66  brain]
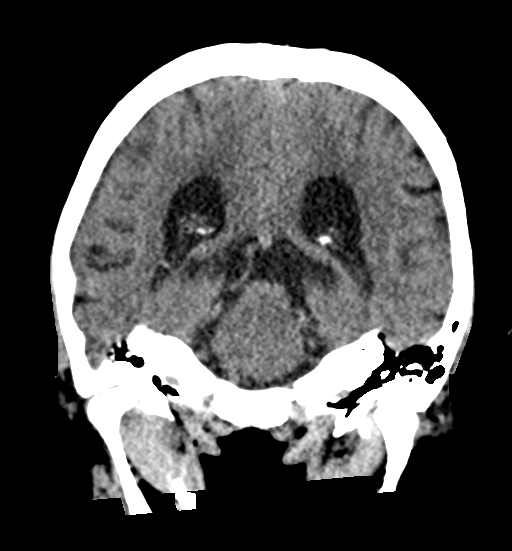

[Series 5: sag soft · sagittal · 0.34mm/px · 3 of 52 slices shown]
[im 18/52  brain]
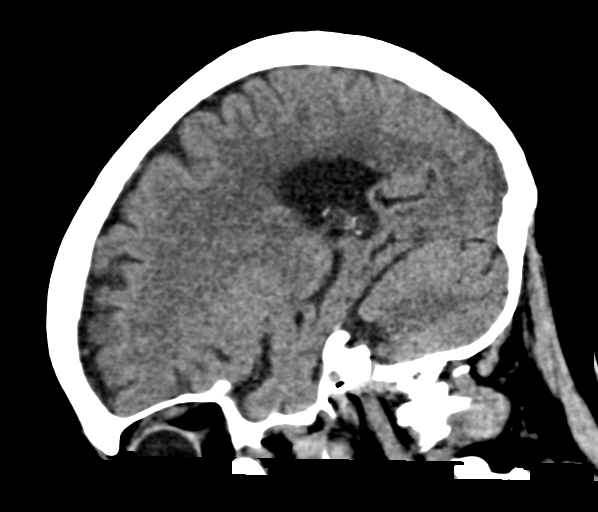
[im 26/52  brain]
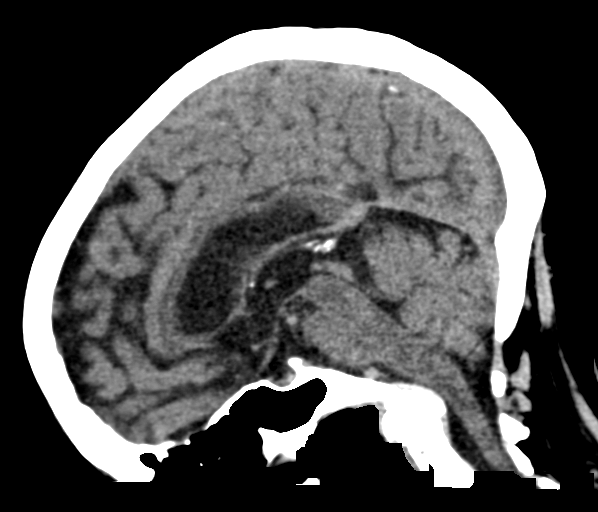
[im 35/52  brain]
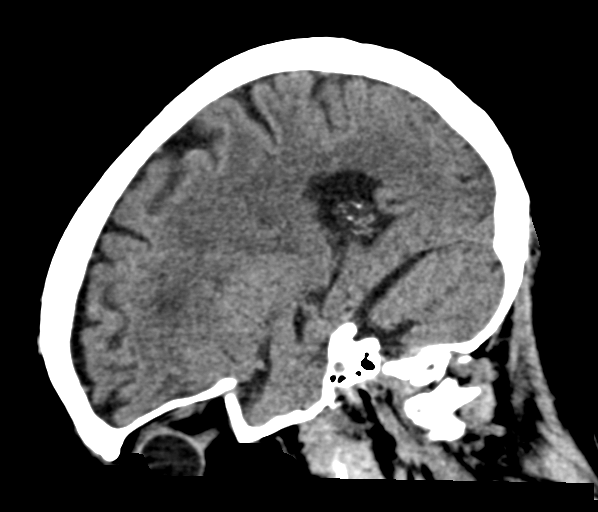

[16 of 47 positions shown; findings below may reference images not displayed]

FINDINGS: Brain: No evidence of acute infarction, hemorrhage, hydrocephalus,
extra-axial collection or mass lesion/mass effect. Periventricular
and deep white matter hypodensity.

Vascular: No hyperdense vessel or unexpected calcification.

Skull: Normal. Negative for fracture or focal lesion.

Sinuses/Orbits: No acute finding.

Other: None.
IMPRESSION: No acute intracranial pathology. Small-vessel white matter disease.
